# Patient Record
Sex: Male | Born: 1970 | Race: White | Hispanic: No | Smoking: Former smoker
Health system: Southern US, Community
[De-identification: ages and names within clinical notes are randomized; demographics above are authoritative.]

## PROBLEM LIST (undated history)

## (undated) DIAGNOSIS — I10 Essential (primary) hypertension: Secondary | ICD-10-CM

## (undated) HISTORY — DX: Essential (primary) hypertension: I10

## (undated) HISTORY — PX: OTHER SURGICAL HISTORY: SHX169

## (undated) HISTORY — PX: ORCHIECTOMY: SHX2116

---

## 2000-10-07 ENCOUNTER — Ambulatory Visit (HOSPITAL_COMMUNITY): Admission: RE | Admit: 2000-10-07 | Discharge: 2000-10-07 | Payer: Self-pay | Admitting: Family Medicine

## 2000-10-07 ENCOUNTER — Encounter: Payer: Self-pay | Admitting: Family Medicine

## 2001-01-17 ENCOUNTER — Emergency Department (HOSPITAL_COMMUNITY): Admission: EM | Admit: 2001-01-17 | Discharge: 2001-01-17 | Payer: Self-pay | Admitting: Emergency Medicine

## 2001-01-17 ENCOUNTER — Encounter: Payer: Self-pay | Admitting: Emergency Medicine

## 2003-07-30 ENCOUNTER — Emergency Department (HOSPITAL_COMMUNITY): Admission: EM | Admit: 2003-07-30 | Discharge: 2003-07-30 | Payer: Self-pay | Admitting: Emergency Medicine

## 2005-02-20 IMAGING — CR DG CHEST 2V
2 series · 2 of 2 positions shown · non-contrast
Comparison: none

CLINICAL DATA: Chest pain
 TWO VIEW CHEST
 No prior study for comparison.  Normal heart size, mediastinal contours and vascularity.  Mild bronchitic changes.  Lungs are otherwise clear.  No effusion or pneumothorax. Bones are unremarkable.
 IMPRESSION
 Minor bronchitic changes without acute infiltrate.

[view not recorded (1 of 2)]
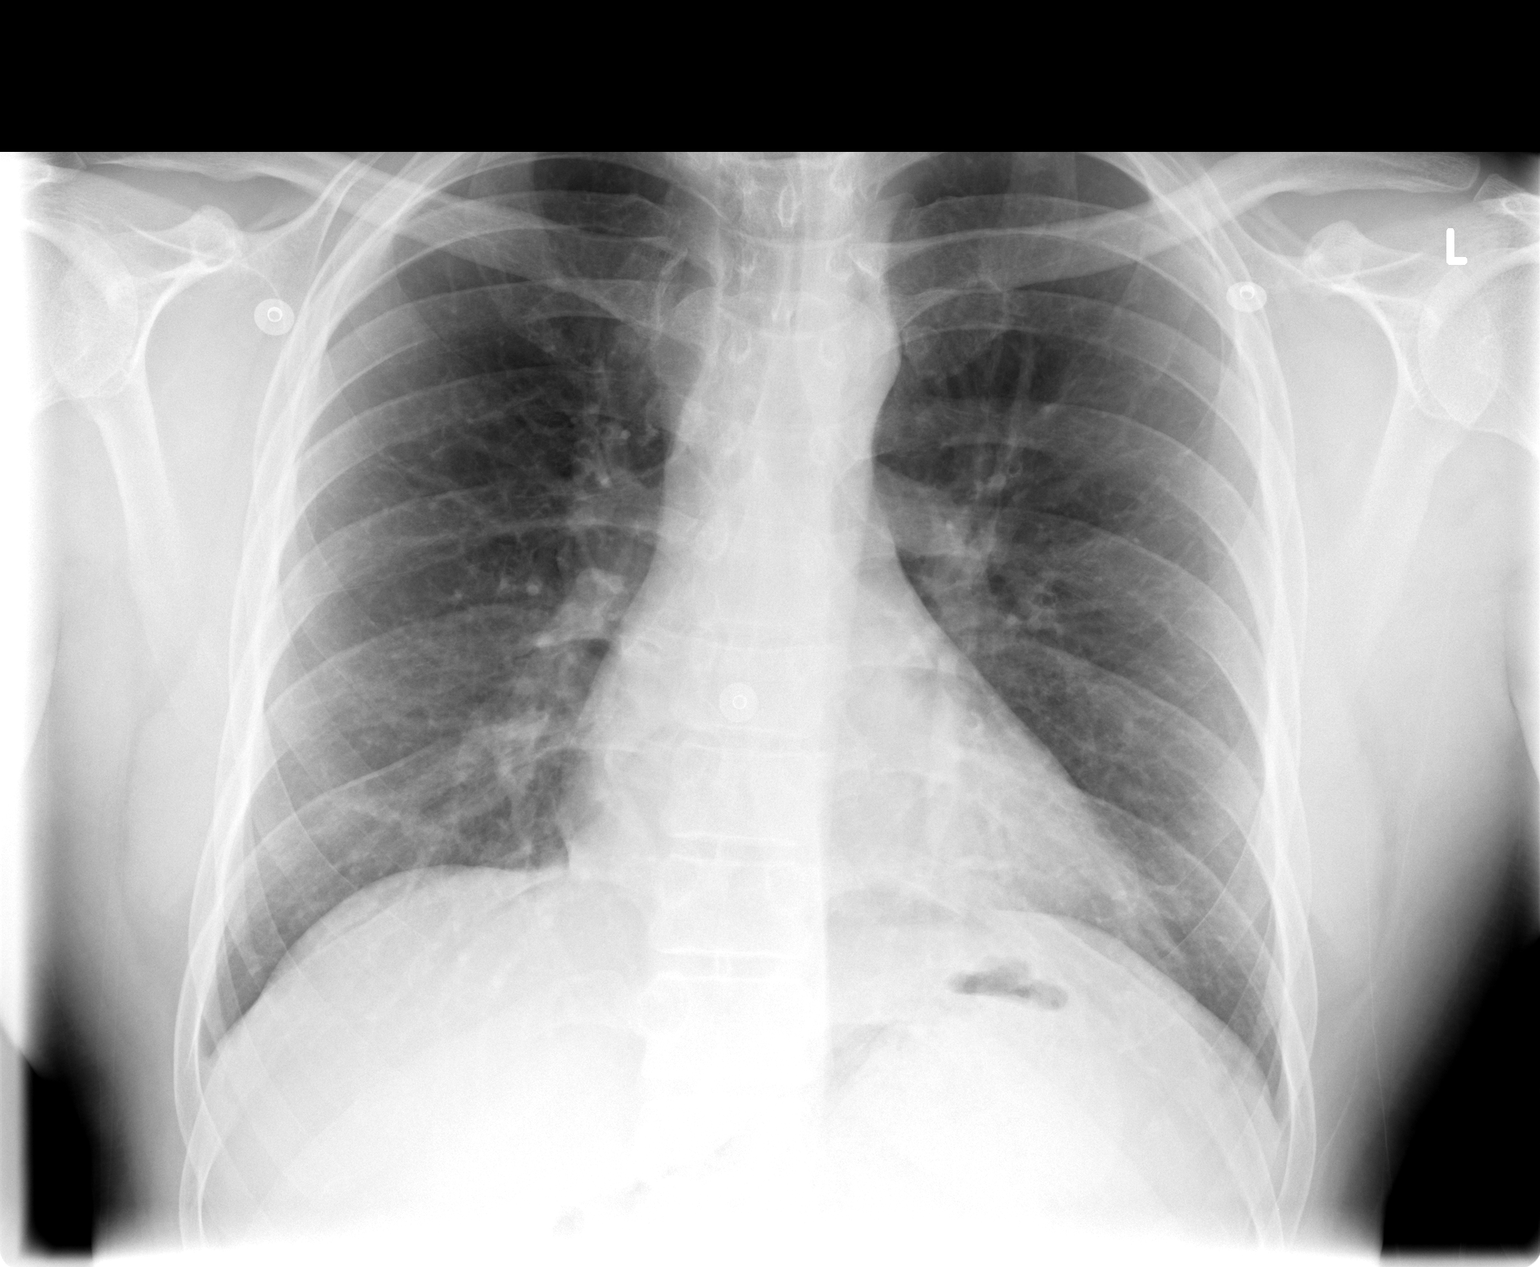

[view not recorded (2 of 2)]
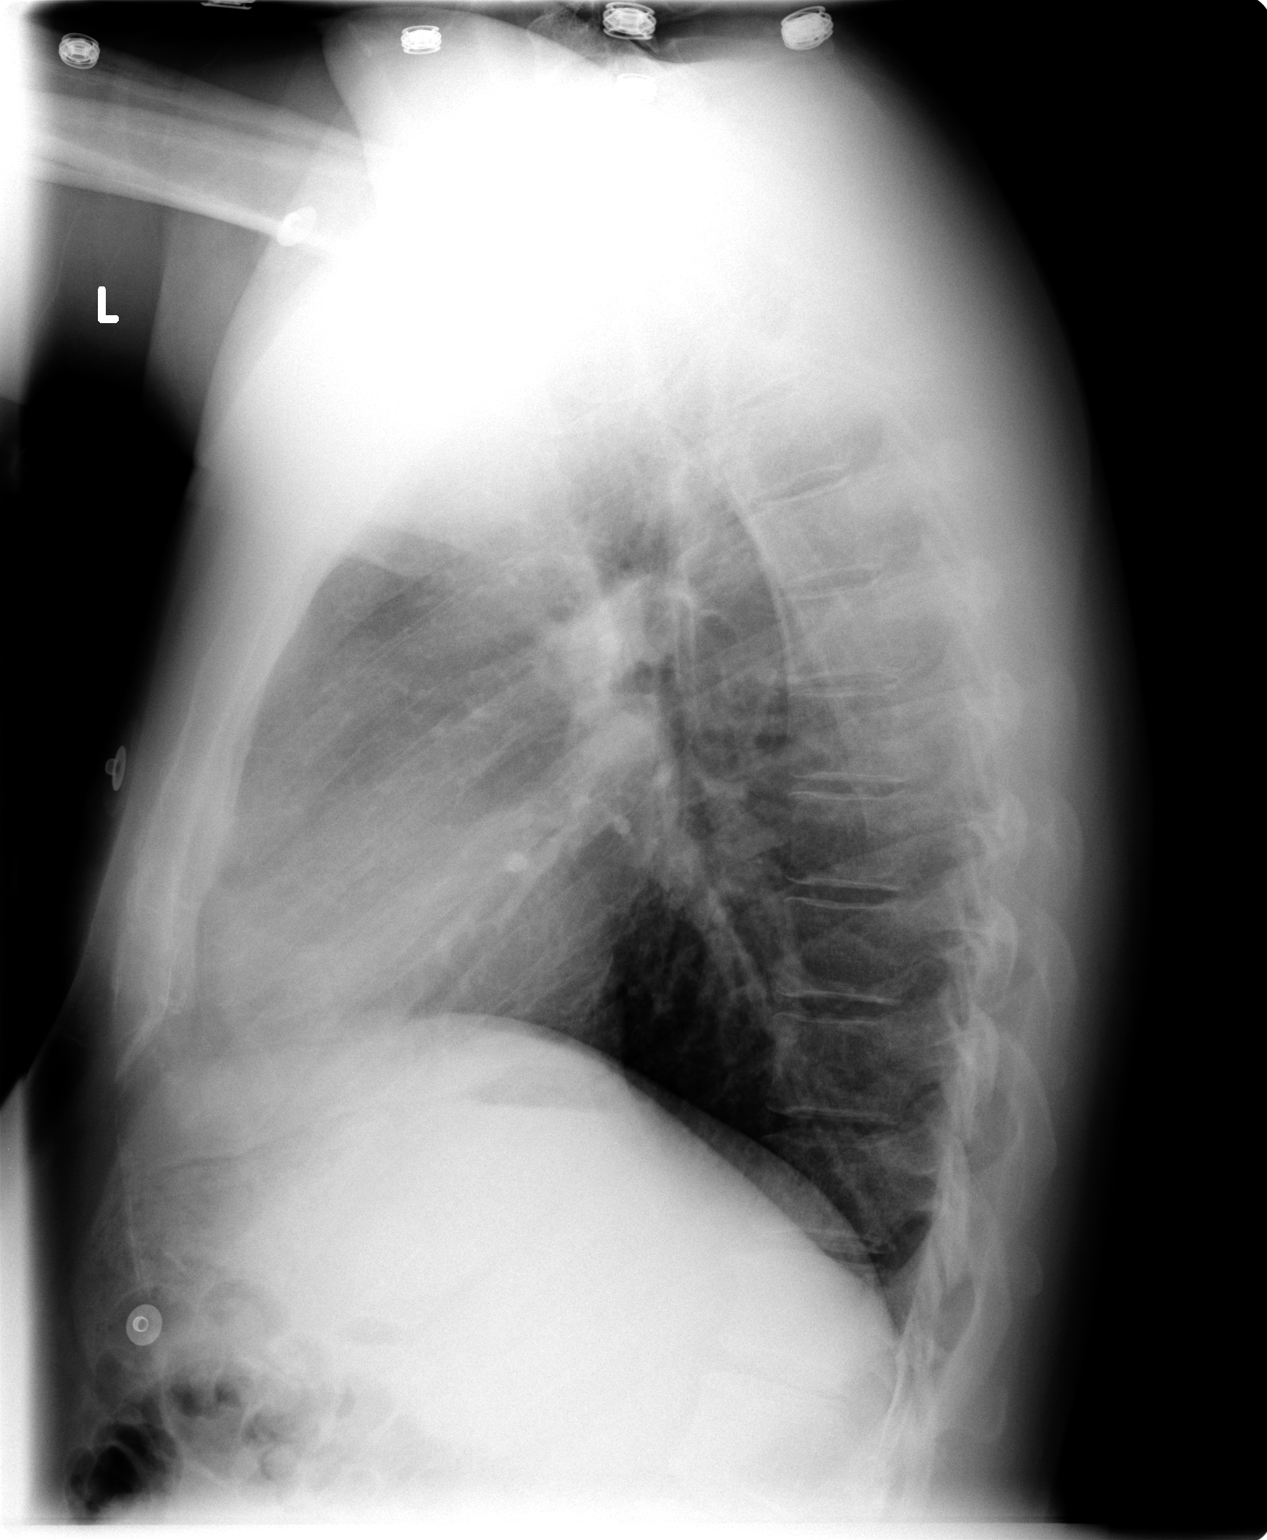

[2 of 2 positions shown; findings below may reference images not displayed]

## 2013-02-15 ENCOUNTER — Ambulatory Visit (INDEPENDENT_AMBULATORY_CARE_PROVIDER_SITE_OTHER): Payer: BC Managed Care – PPO | Admitting: Otolaryngology

## 2013-02-15 ENCOUNTER — Encounter (INDEPENDENT_AMBULATORY_CARE_PROVIDER_SITE_OTHER): Payer: Self-pay

## 2013-02-15 DIAGNOSIS — R1312 Dysphagia, oropharyngeal phase: Secondary | ICD-10-CM

## 2013-02-15 DIAGNOSIS — K219 Gastro-esophageal reflux disease without esophagitis: Secondary | ICD-10-CM

## 2013-03-15 ENCOUNTER — Ambulatory Visit (INDEPENDENT_AMBULATORY_CARE_PROVIDER_SITE_OTHER): Payer: BC Managed Care – PPO | Admitting: Otolaryngology

## 2013-03-15 DIAGNOSIS — R070 Pain in throat: Secondary | ICD-10-CM

## 2013-03-15 DIAGNOSIS — K219 Gastro-esophageal reflux disease without esophagitis: Secondary | ICD-10-CM

## 2016-03-15 ENCOUNTER — Other Ambulatory Visit: Payer: Self-pay | Admitting: Preventative Medicine

## 2016-03-15 DIAGNOSIS — M48061 Spinal stenosis, lumbar region without neurogenic claudication: Secondary | ICD-10-CM

## 2016-03-25 ENCOUNTER — Ambulatory Visit
Admission: RE | Admit: 2016-03-25 | Discharge: 2016-03-25 | Disposition: A | Discharge status: Home / Self Care | Payer: BLUE CROSS/BLUE SHIELD | Source: Ambulatory Visit | Attending: Preventative Medicine | Admitting: Preventative Medicine

## 2016-03-25 DIAGNOSIS — M48061 Spinal stenosis, lumbar region without neurogenic claudication: Secondary | ICD-10-CM

## 2016-03-25 MED ORDER — METHYLPREDNISOLONE ACETATE 40 MG/ML INJ SUSP (RADIOLOG
120.0000 mg | Freq: Once | INTRAMUSCULAR | Status: AC
Start: 2016-03-25 — End: 2016-03-25
  Administered 2016-03-25: 120 mg via EPIDURAL

## 2016-03-25 MED ORDER — IOPAMIDOL (ISOVUE-M 200) INJECTION 41%
1.0000 mL | Freq: Once | INTRAMUSCULAR | Status: AC
  Administered 2016-03-25: 1 mL via EPIDURAL

## 2016-03-25 NOTE — Discharge Instructions (Signed)

## 2017-10-17 IMAGING — XA Imaging study
2 series · 2 of 2 positions shown · non-contrast
Comparison: none

CLINICAL DATA: Spinal stenosis of the lumbar region. Displacement
of the L5-S1 lumbar disc. Left lower extremity radiculitis.

[Series 1: ortho standard · 1 of 1 slices shown (1 of 2)]
[im 1/1]
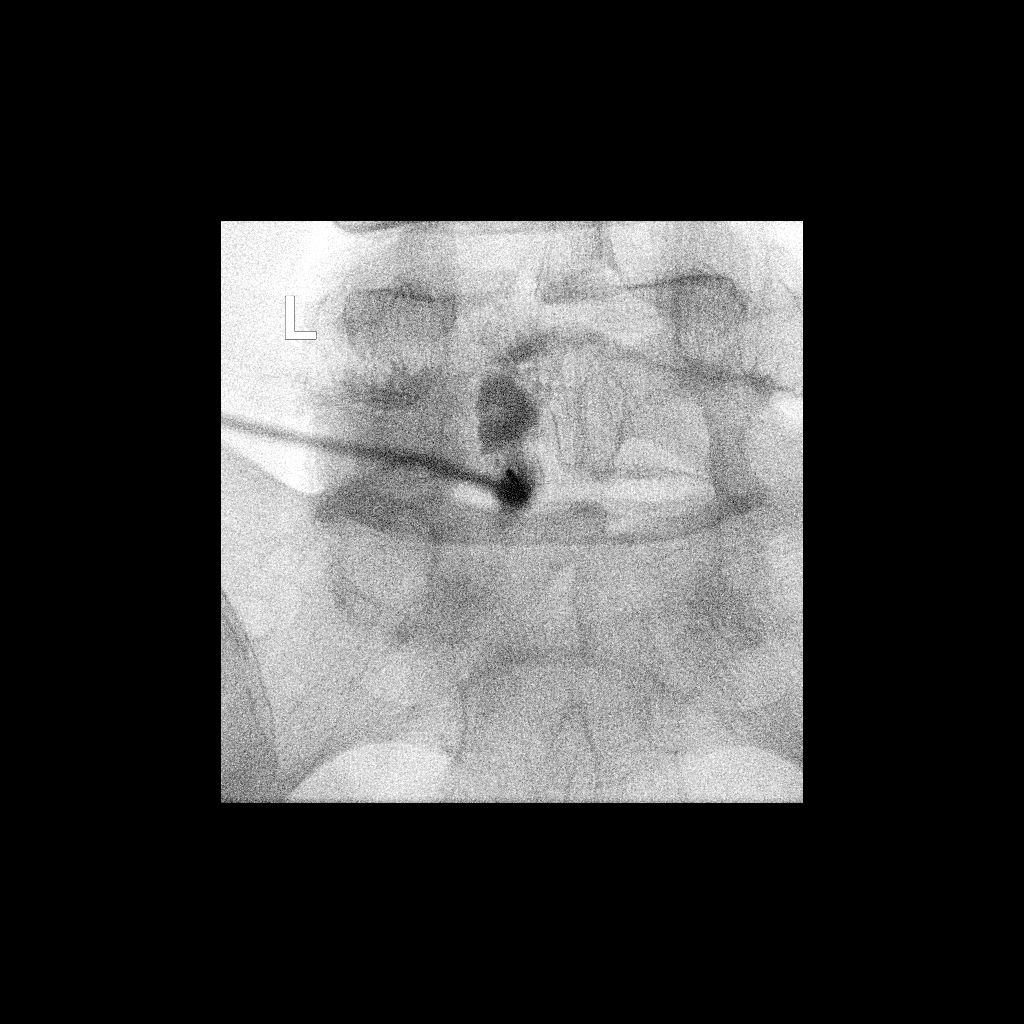

[Series 2: ortho standard · 1 of 1 slices shown (2 of 2)]
[im 1/1]
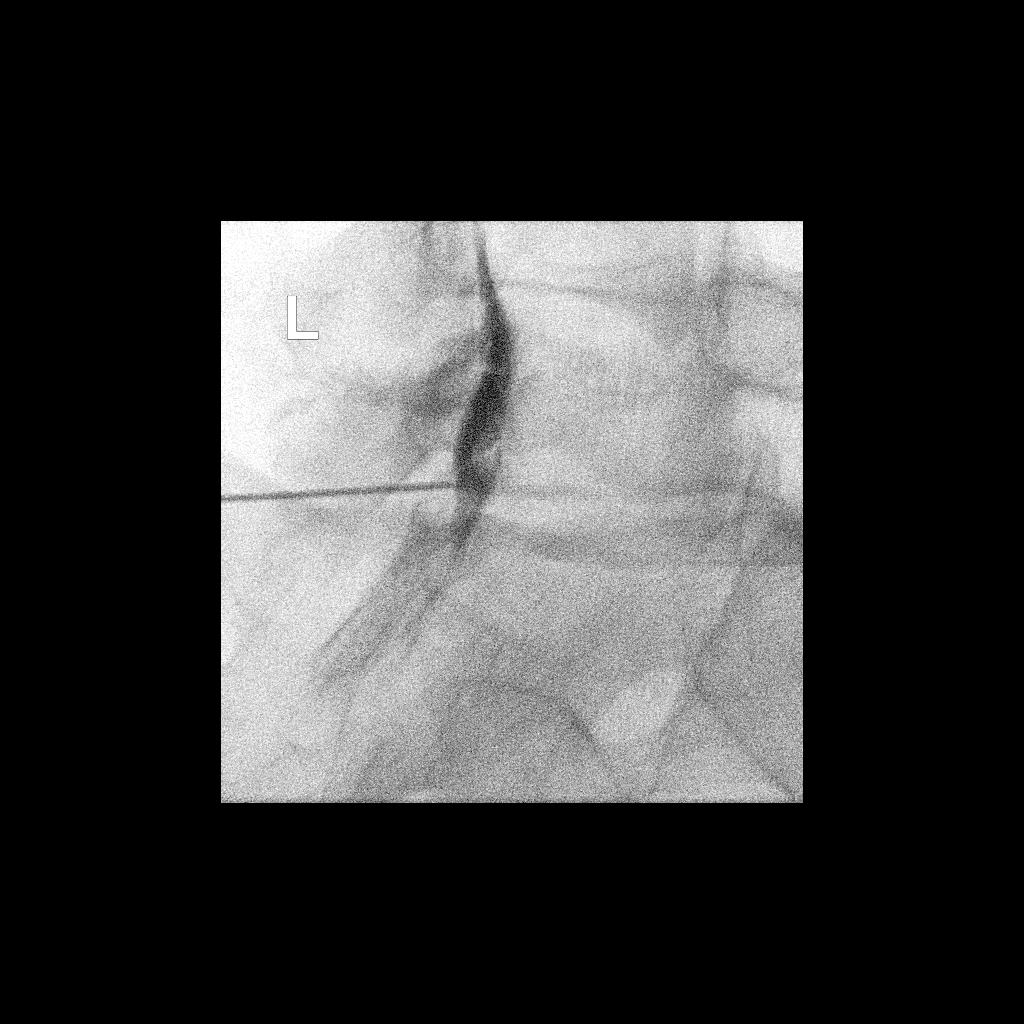

[2 of 2 positions shown; findings below may reference images not displayed]

FLUOROSCOPY TIME:  Radiation Exposure Index (as provided by the
fluoroscopic device): 14.78 uGy*m2

Fluoroscopy Time:  16 seconds

Number of Acquired Images:  0

PROCEDURE:
The procedure, risks, benefits, and alternatives were explained to
the patient. Questions regarding the procedure were encouraged and
answered. The patient understands and consents to the procedure.

LUMBAR EPIDURAL INJECTION:

An interlaminar approach was performed on right left at lumbar
level. The overlying skin was cleansed and anesthetized. A 20 gauge
epidural needle was advanced using loss-of-resistance technique.

DIAGNOSTIC EPIDURAL INJECTION:

Injection of Isovue-M 200 shows a good epidural pattern with spread
above and below the level of needle placement, primarily on the
right/left no vascular opacification is seen.

THERAPEUTIC EPIDURAL INJECTION:

120 Mg of Depo-Medrol mixed with 3 mL 1% lidocaine were instilled.
The procedure was well-tolerated, and the patient was discharged
thirty minutes following the injection in good condition.

COMPLICATIONS:
None
IMPRESSION: Technically successful epidural injection on the left L5-S1 # 1

## 2019-04-19 ENCOUNTER — Encounter: Payer: Self-pay | Admitting: Gastroenterology

## 2019-05-10 ENCOUNTER — Encounter: Payer: Self-pay | Admitting: *Deleted

## 2019-07-30 ENCOUNTER — Ambulatory Visit (INDEPENDENT_AMBULATORY_CARE_PROVIDER_SITE_OTHER): Payer: Self-pay | Admitting: *Deleted

## 2019-07-30 ENCOUNTER — Other Ambulatory Visit: Payer: Self-pay

## 2019-07-30 DIAGNOSIS — Z1211 Encounter for screening for malignant neoplasm of colon: Secondary | ICD-10-CM

## 2019-07-30 NOTE — Patient Instructions (Signed)
[place]   Please notify us immediately if you are diabetic, take iron supplements, or if you are on coumadin or any blood thinners.   Patient Name: Keith Adkins  Date of procedure: 10/17/2019  Time to register at [facility]: 6:30 Provider: Dr. Gala Romney   Purchase: MIRALAX 238 gram bottle, 1 FLEET ENEMA, 1 box of DULCOLAX (All over the counter medications)    10/15/2019- 2 Days prior to procedure: START CLEAR LIQUID DIET AFTER YOUR LUNCH MEAL--NO SOLID FOODS!   10/16/2019- 1 Day prior to procedure:   CLEAR LIQUIDS ALL DAY--NO SOLID FOODS!   Diabetic medication adjustments for today:    At 10:00 AM, take 2 DULCOLAX '5mg'$  tablets   At 12:00 PM, Mix 5 teaspoons of Miralax in any 4-6 ounces of CLEAR LIQUIDS (Gatorade) every hour for 5 hours until passing clear, watery stools. Be sure to drink 4 ounces of clear liquid 30 minutes after each dose of Miralax.   At 3:00 PM, take 2 Dulcolax '5mg'$  tablets   If stools are not clear & watery by 6:00 PM, take 5 teaspoons of Miralax every 30 minutes until stools are clear (no color)   You must have a complete prep to ensure the most effective cleaning.   CONTINUE CLEAR LIQUIDS ONLY UNTIL MIDNIGHT. Make a conscious effort to drink as much as you can before, during & after the preparation.    NOTHING TO EAT OR DRINK AFTER MIDNIGHT except for your heart, blood pressure & breathing medications. You may take them with a sip of clear liquids.   10/17/2019-- Day of Procedure  Give yourself one Fleet enema about 1 hour prior to leaving for the hospital.   Diabetic medication adjustments for today:   You may take TYLENOL products. Please continue your regular medications unless we have instructed you otherwise.    Please note, on the day of your procedure you MUST be accompanied by an adult who is willing to assume responsibility for you at time of discharge. If you do not have such person with you, your procedure  will have to be rescheduled.                                                             Please leave ALL jewelry at home prior to coming to the hospital for your procedure.   *It is your responsibility to check with your insurance company for the benefits of coverage you have for this procedure. Unfortunately, not all insurance companies have benefits to cover all or part of these types of procedures. It is your responsibility to check your benefits, however we will be glad to assist you with any codes your insurance company may need.   Please note that most insurance companies will not cover a screening colonoscopy for people under the age of 26. For example, with some insurance companies you may have benefits for a screening colonoscopy, but if polyps are found the diagnosis will change and then you may have a deductible that will need to be met. Please make sure you check your benefits for screening colonoscopy as well as a diagnostic colonoscopy.    CLEAR LIQUIDS: (NO RED)  Jello Apple Juice White Grape Juice Water  Banana popsicles Kool-Aid Coffee(No cream or milk)  Tea (  No cream or milk) Soft drinks Broth (fat free beef/chicken/vegetable)   Clear liquids allow you to see your fingers on the other side of the glass. Be sure they are NOT RED in color, cloudy, but CLEAR.   Do Not Eat:  Dairy products of any kind Cranberry juice  Tomato or V8 Juice Orange Juice  Grapefruit Juice Red Grape Juice  Solid foods like cereal, oatmeal, yogurt, fruits, vegetables, creamed soups, eggs, bread, etc   HELPFUL HINTS TO MAKE DRINKING EASIER:  -Make sure prep is extremely COLD. Refrigerate the night before. You may also put in freezer.  -You may try mixing Crystal Light or Country Time Lemonade if you prefer. MIx in small amounts. Add more if necessary.  -Trying drinking through a straw.  -Rinse mouth with water or mouthwash between  glasses to remove aftertaste.  -Try sipping on a cold beverage/ice popsicles between glasses of prep.  -Place a piece of sugar-free hard candy in mouth between glasses.  -If you become nauseated, try consuming smaller amounts or stretch out the time between glasses. Stop for 30 minutes to an hour & slowly start back drinking.   Call our office with any questions or concerns at [phone].   Thank You

## 2019-07-30 NOTE — Progress Notes (Signed)
I think given chronic moderate ETOH intake the patient will likely benefit from propofol/MAC

## 2019-07-30 NOTE — Progress Notes (Addendum)
Gastroenterology Pre-Procedure Review  Request Date: 07/30/2019 Requesting Physician: Dr. Sudie Bailey, no previous TCS  PATIENT REVIEW QUESTIONS: The patient responded to the following health history questions as indicated:    1. Diabetes Melitis: no 2. Joint replacements in the past 12 months: no 3. Major health problems in the past 3 months: no 4. Has an artificial valve or MVP: no 5. Has a defibrillator: no 6. Has been advised in past to take antibiotics in advance of a procedure like teeth cleaning: no 7. Family history of colon cancer: no  8. Alcohol Use: yes, 12 beers a week 9. Illicit drug Use: no 10. History of sleep apnea: no  11. History of coronary artery or other vascular stents placed within the last 12 months: no 12. History of any prior anesthesia complications: no 13. There is no height or weight on file to calculate BMI. ht: 5'10 wt: 165 lbs    MEDICATIONS & ALLERGIES:    Patient reports the following regarding taking any blood thinners:   Plavix? no Aspirin? no Coumadin? no Brilinta? no Xarelto? no Eliquis? no Pradaxa? no Savaysa? no Effient? no  Patient confirms/reports the following medications:  Current Outpatient Medications  Medication Sig Dispense Refill  . lisinopril-hydrochlorothiazide (ZESTORETIC) 20-25 MG tablet Take 1 tablet by mouth every morning.     No current facility-administered medications for this visit.    Patient confirms/reports the following allergies:  No Known Allergies  No orders of the defined types were placed in this encounter.   AUTHORIZATION INFORMATION Primary Insurance: [place],  [place] #: [id number]  Group #: [id number] Pre-Cert / Berkley Harvey required: No, not required  SCHEDULE INFORMATION: Procedure has been scheduled as follows:  Date: 10/17/2019, Time: 7:30 Location: [facility] with Dr. Jena Gauss  This Gastroenterology Pre-Precedure Review Form is being routed to the following provider(s): Wynne Dust, NP

## 2019-07-31 NOTE — Progress Notes (Signed)
Called pt and scheduled him for ov 08/20/2019 at 8:30 with EG.  Pt is aware that we are cancelling his procedure and Covid screening.  Pt voiced understanding.

## 2019-08-20 ENCOUNTER — Ambulatory Visit: Payer: BLUE CROSS/BLUE SHIELD | Admitting: Nurse Practitioner

## 2019-08-20 NOTE — Progress Notes (Deleted)
Primary Care Physician:  Lemmie Evens, MD Primary Gastroenterologist:  Dr. Gala Romney  No chief complaint on file.   HPI:   Keith Adkins is a 49 y.o. male who presents to schedule colonoscopy.  Nurse/phone triage was deferred office visit due to EtOH likely necessitating MAC sedation.  No history of colonoscopy in our system.  Today she states   No past medical history on file.  *** The histories are not reviewed yet. Please review them in the "History" navigator section and refresh this [facility].  Current Outpatient Medications  Medication Sig Dispense Refill  . lisinopril-hydrochlorothiazide (ZESTORETIC) 20-25 MG tablet Take 1 tablet by mouth every morning.     No current facility-administered medications for this visit.    Allergies as of 08/20/2019  . (No Known Allergies)    No family history on file.  Social History   Socioeconomic History  . Marital status: Married    Spouse name: Not on file  . Number of children: Not on file  . Years of education: Not on file  . Highest education level: Not on file  Occupational History  . Not on file  Tobacco Use  . Smoking status: Not on file  Substance and Sexual Activity  . Alcohol use: Not on file  . Drug use: Not on file  . Sexual activity: Not on file  Other Topics Concern  . Not on file  Social History Narrative  . Not on file   Social Determinants of Health   Financial Resource Strain:   . Difficulty of Paying Living Expenses:   Food Insecurity:   . Worried About Charity fundraiser in the Last Year:   . Arboriculturist in the Last Year:   Transportation Needs:   . Film/video editor (Medical):   Marland Kitchen Lack of Transportation (Non-Medical):   Physical Activity:   . Days of Exercise per Week:   . Minutes of Exercise per Session:   Stress:   . Feeling of Stress :   Social Connections:   . Frequency of Communication with Friends and Family:   . Frequency of Social Gatherings with Friends and  Family:   . Attends Religious Services:   . Active Member of Clubs or Organizations:   . Attends Archivist Meetings:   Marland Kitchen Marital Status:   Intimate Partner Violence:   . Fear of Current or Ex-Partner:   . Emotionally Abused:   Marland Kitchen Physically Abused:   . Sexually Abused:     Subjective: Review of Systems  Constitutional: Negative for chills, fever, malaise/fatigue and weight loss.  HENT: Negative for congestion and sore throat.   Respiratory: Negative for cough and shortness of breath.   Cardiovascular: Negative for chest pain and palpitations.  Gastrointestinal: Negative for abdominal pain, blood in stool, diarrhea, melena, nausea and vomiting.  Musculoskeletal: Negative for joint pain and myalgias.  Skin: Negative for rash.  Neurological: Negative for dizziness and weakness.  Endo/Heme/Allergies: Does not bruise/bleed easily.  Psychiatric/Behavioral: Negative for depression. The patient is not nervous/anxious.   All other systems reviewed and are negative.      Objective: There were no vitals taken for this visit. Physical Exam Vitals and nursing note reviewed.  Constitutional:      General: He is not in acute distress.    Appearance: Normal appearance. He is not ill-appearing, toxic-appearing or diaphoretic.  HENT:     Head: Normocephalic and atraumatic.     Nose: No congestion or rhinorrhea.  Eyes:     General: No scleral icterus. Cardiovascular:     Rate and Rhythm: Normal rate and regular rhythm.     Heart sounds: Normal heart sounds.  Pulmonary:     Effort: Pulmonary effort is normal.     Breath sounds: Normal breath sounds.  Abdominal:     General: Bowel sounds are normal. There is no distension.     Palpations: Abdomen is soft. There is no hepatomegaly, splenomegaly or mass.     Tenderness: There is no abdominal tenderness. There is no guarding or rebound.     Hernia: No hernia is present.  Musculoskeletal:     Cervical back: Neck supple.  Skin:     General: Skin is warm and dry.     Coloration: Skin is not jaundiced.     Findings: No bruising or rash.  Neurological:     General: No focal deficit present.     Mental Status: He is alert and oriented to person, place, and time. Mental status is at baseline.  Psychiatric:        Mood and Affect: Mood normal.        Behavior: Behavior normal.        Thought Content: Thought content normal.       08/20/2019 7:55 AM   Disclaimer: This note was dictated with voice recognition software. Similar sounding words can inadvertently be transcribed and may not be corrected upon review.

## 2019-08-21 ENCOUNTER — Encounter: Payer: Self-pay | Admitting: Gastroenterology

## 2019-08-21 NOTE — Progress Notes (Signed)
Referring Provider: Dr. Karie Kirks Primary Care Physician:  Lemmie Evens, MD Primary Gastroenterologist:  Dr. Gala Romney  Chief Complaint  Patient presents with  . Colonoscopy    never had tcs    HPI:   Keith Adkins is a 49 y.o. male presenting today at the request of Dr. Karie Kirks for colon cancer screening.  No prior colonoscopy.  Patient was previously a nurse triage; however, due to report of drinking about 12 beers a week, it was recommended that patient would likely benefit from propofol/MAC.   Reviewed labs included with referral dated 04/19/2019.  Hemoglobin slightly low at 12.9 with normocytic indices.  CMP within normal limits.  See labs below. Per PCP addendum on his note, Dr. Karie Kirks states he reviewed patient's prior hemoglobins between 10/04/2011 and 02/10/2018 with 5 test during this time and all hemoglobins were between 14 and 15.  Today: No prior coloscopy. No abdominal pain. BMs daily. No constipation or diarrhea. No blood the the stool or black stool. No GERD symptoms, heartburn, dysphagia. No unintentional weight loss. No family history of colon cancer.   No NSAIDs.   No known history of anemia.   Past Medical History:  Diagnosis Date  . HTN (hypertension)     History reviewed. No pertinent surgical history.  Current Outpatient Medications  Medication Sig Dispense Refill  . lisinopril-hydrochlorothiazide (ZESTORETIC) 20-25 MG tablet Take 1 tablet by mouth every morning.     No current facility-administered medications for this visit.    Allergies as of 08/22/2019  . (No Known Allergies)    Family History  Problem Relation Age of Onset  . Colon cancer Neg Hx     Social History   Socioeconomic History  . Marital status: Married    Spouse name: Not on file  . Number of children: Not on file  . Years of education: Not on file  . Highest education level: Not on file  Occupational History  . Not on file  Tobacco Use  . Smoking status: Former Research scientist (life sciences)   . Smokeless tobacco: Never Used  Substance and Sexual Activity  . Alcohol use: Yes    Alcohol/week: 12.0 standard drinks    Types: 12 Cans of beer per week    Comment: 12 beer per week  . Drug use: Never  . Sexual activity: Not on file  Other Topics Concern  . Not on file  Social History Narrative  . Not on file   Social Determinants of Health   Financial Resource Strain:   . Difficulty of Paying Living Expenses:   Food Insecurity:   . Worried About Charity fundraiser in the Last Year:   . Arboriculturist in the Last Year:   Transportation Needs:   . Film/video editor (Medical):   Marland Kitchen Lack of Transportation (Non-Medical):   Physical Activity:   . Days of Exercise per Week:   . Minutes of Exercise per Session:   Stress:   . Feeling of Stress :   Social Connections:   . Frequency of Communication with Friends and Family:   . Frequency of Social Gatherings with Friends and Family:   . Attends Religious Services:   . Active Member of Clubs or Organizations:   . Attends Archivist Meetings:   Marland Kitchen Marital Status:   Intimate Partner Violence:   . Fear of Current or Ex-Partner:   . Emotionally Abused:   Marland Kitchen Physically Abused:   . Sexually Abused:     Review  of Systems: Gen: Denies any fever, chills, lightheadedness, dizziness, presyncope, syncope CV: Denies chest pain or heart palpitations Resp: Denies shortness of breath or cough GI: See HPI GU : Denies urinary burning, urinary frequency, urinary hesitancy MS: Denies joint pain Derm: Denies rash Psych: Denies depression or anxiety Heme: Denies bruising or bleeding  Physical Exam: BP 119/69   Pulse (!) 55   Temp (!) 96.8 F (36 C) (Temporal)   Ht _0  (1.778 m)   Wt 164 lb (74.4 kg)   BMI 23.53 kg/m  General:   Alert and oriented. Pleasant and cooperative. Well-nourished and well-developed.  Head:  Normocephalic and atraumatic. Eyes:  Without icterus, sclera clear and conjunctiva pink.  Ears:   Normal auditory acuity. Lungs:  Clear to auscultation bilaterally. No wheezes, rales, or rhonchi. No distress.  Heart:  S1, S2 present without murmurs appreciated.  Abdomen:  +BS, soft, non-tender and non-distended. No HSM noted. No guarding or rebound. No masses appreciated.  Rectal:  Deferred  Msk:  Symmetrical without gross deformities. Normal posture. Extremities:  Without edema. Neurologic:  Alert and  oriented x4;  grossly normal neurologically. Skin:  Intact without significant lesions or rashes. Psych: Normal mood and affect.  Labs: 04/19/2019 CBC: WBC 4.2, hemoglobin 12.9 (L), MCV 95.9, MCH 32.7, MCHC 34.1, platelets 159. CMP: Glucose 100 (H), creatinine 0.99, sodium 136, potassium 4.6, chloride 99, calcium 9.4, albumin 4.3, total bilirubin 0.8, alk phos 98, AST 30, ALT 27 PSA 0.4 Lipid panel: Total cholesterol 203 (H), LDL 106 (H), HDL 83, triglycerides 58

## 2019-08-22 ENCOUNTER — Encounter: Payer: Self-pay | Admitting: Gastroenterology

## 2019-08-22 ENCOUNTER — Ambulatory Visit: Payer: BLUE CROSS/BLUE SHIELD | Admitting: Gastroenterology

## 2019-08-22 ENCOUNTER — Other Ambulatory Visit: Payer: Self-pay

## 2019-08-22 DIAGNOSIS — Z1211 Encounter for screening for malignant neoplasm of colon: Secondary | ICD-10-CM

## 2019-08-22 DIAGNOSIS — D649 Anemia, unspecified: Secondary | ICD-10-CM | POA: Insufficient documentation

## 2019-08-22 NOTE — Patient Instructions (Signed)
Please have labs completed at [facility] lab.  We will get you scheduled for colonoscopy with possible upper endoscopy in the near future with Dr. Jena Gauss.  You will need the upper endoscopy if your hemoglobin is still low with any component of iron deficiency.   We will see you back as recommended as Dr. Jena Gauss recommends at the time of your procedures.  Ermalinda Memos, PA-C [facility]

## 2019-08-23 NOTE — Assessment & Plan Note (Addendum)
49 year old male with medical history of hypertension presenting to schedule his first ever colonoscopy.  He denies any significant upper or lower GI symptoms.  No BRBPR, melena, or unintentional weight loss.  No family history of colon cancer.  Notably, labs included in referral with hemoglobin slightly low at 12.9 with normocytic indices.  Per PCP note, prior hemoglobins since 2013 have been between 14-15 range.  Patient denies NSAID use.  He does admit to drinking about 12 beers per week which could be playing a role in his mild anemia but can't rule out occult blood loss from upper or lower GI tract.  No iron panel on file.  Patient needs first ever screening colonoscopy with propofol with Dr. Jena Gauss in the near future.   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   Due to mild anemia, plan to repeat CBC and check an iron panel and add possible EGD to colonoscopy.  Further recommendations regarding necessity of EGD pending lab results. The risks, benefits, and alternatives to TCS/EGD have been discussed in detail with patient. They have stated understanding and desire to proceed.  Plan follow-up as recommended at the time of procedures.

## 2019-08-23 NOTE — Assessment & Plan Note (Signed)
Addressed under colon cancer screening. 

## 2019-09-07 ENCOUNTER — Emergency Department (HOSPITAL_COMMUNITY)
Admission: EM | Admit: 2019-09-07 | Discharge: 2019-09-07 | Disposition: A | Discharge status: Home / Self Care | Payer: BC Managed Care – PPO | Attending: Emergency Medicine | Admitting: Emergency Medicine

## 2019-09-07 DIAGNOSIS — R519 Headache, unspecified: Secondary | ICD-10-CM | POA: Diagnosis not present

## 2019-09-07 DIAGNOSIS — I10 Essential (primary) hypertension: Secondary | ICD-10-CM | POA: Diagnosis not present

## 2019-09-07 DIAGNOSIS — F1910 Other psychoactive substance abuse, uncomplicated: Secondary | ICD-10-CM | POA: Diagnosis not present

## 2019-09-07 DIAGNOSIS — Z79899 Other long term (current) drug therapy: Secondary | ICD-10-CM | POA: Insufficient documentation

## 2019-09-07 DIAGNOSIS — F10929 Alcohol use, unspecified with intoxication, unspecified: Secondary | ICD-10-CM | POA: Diagnosis not present

## 2019-09-07 DIAGNOSIS — R42 Dizziness and giddiness: Secondary | ICD-10-CM | POA: Insufficient documentation

## 2019-09-07 DIAGNOSIS — Y905 Blood alcohol level of 100-119 mg/100 ml: Secondary | ICD-10-CM | POA: Diagnosis not present

## 2019-09-07 DIAGNOSIS — T407X1A Poisoning by cannabis (derivatives), accidental (unintentional), initial encounter: Secondary | ICD-10-CM | POA: Diagnosis present

## 2019-09-07 LAB — CBC WITH DIFFERENTIAL/PLATELET
Abs Immature Granulocytes: 0.03 10*3/uL (ref 0.00–0.07)
Basophils Absolute: 0 10*3/uL (ref 0.0–0.1)
Basophils Relative: 0 %
Eosinophils Absolute: 0 10*3/uL (ref 0.0–0.5)
Eosinophils Relative: 0 %
HCT: 43 % (ref 39.0–52.0)
Hemoglobin: 14.3 g/dL (ref 13.0–17.0)
Immature Granulocytes: 0 %
Lymphocytes Relative: 31 %
Lymphs Abs: 2.7 10*3/uL (ref 0.7–4.0)
MCH: 32.3 pg (ref 26.0–34.0)
MCHC: 33.3 g/dL (ref 30.0–36.0)
MCV: 97.1 fL (ref 80.0–100.0)
Monocytes Absolute: 0.7 10*3/uL (ref 0.1–1.0)
Monocytes Relative: 8 %
Neutro Abs: 5.2 10*3/uL (ref 1.7–7.7)
Neutrophils Relative %: 61 %
Platelets: 205 10*3/uL (ref 150–400)
RBC: 4.43 MIL/uL (ref 4.22–5.81)
RDW: 12.4 % (ref 11.5–15.5)
WBC: 8.6 10*3/uL (ref 4.0–10.5)
nRBC: 0 % (ref 0.0–0.2)

## 2019-09-07 LAB — COMPREHENSIVE METABOLIC PANEL
ALT: 30 U/L (ref 0–44)
AST: 37 U/L (ref 15–41)
Albumin: 4.5 g/dL (ref 3.5–5.0)
Alkaline Phosphatase: 118 U/L (ref 38–126)
Anion gap: 15 (ref 5–15)
BUN: 24 mg/dL — ABNORMAL HIGH (ref 6–20)
CO2: 22 mmol/L (ref 22–32)
Calcium: 9.1 mg/dL (ref 8.9–10.3)
Chloride: 100 mmol/L (ref 98–111)
Creatinine, Ser: 1.08 mg/dL (ref 0.61–1.24)
GFR calc Af Amer: >60 mL/min (ref >60)
GFR calc non Af Amer: >60 mL/min (ref >60)
Glucose, Bld: 102 mg/dL — ABNORMAL HIGH (ref 70–99)
Potassium: 3.3 mmol/L — ABNORMAL LOW (ref 3.5–5.1)
Sodium: 137 mmol/L (ref 135–145)
Total Bilirubin: 0.7 mg/dL (ref 0.3–1.2)
Total Protein: 7.9 g/dL (ref 6.5–8.1)

## 2019-09-07 LAB — AMMONIA: Ammonia: 18 umol/L (ref 9–35)

## 2019-09-07 LAB — ETHANOL: Alcohol, Ethyl (B): 116 mg/dL — ABNORMAL HIGH (ref <10)

## 2019-09-07 LAB — SALICYLATE LEVEL: Salicylate Lvl: <7 mg/dL — ABNORMAL LOW (ref 7.0–30.0)

## 2019-09-07 LAB — ACETAMINOPHEN LEVEL: Acetaminophen (Tylenol), Serum: <10 ug/mL — ABNORMAL LOW (ref 10–30)

## 2019-09-07 MED ORDER — SODIUM CHLORIDE 0.9 % IV BOLUS (SEPSIS)
1000.0000 mL | Freq: Once | INTRAVENOUS | Status: AC
  Administered 2019-09-07: 1000 mL via INTRAVENOUS

## 2019-09-07 NOTE — Discharge Instructions (Addendum)
Please be sure to stay hydrated.  Please get plenty of rest.

## 2019-09-07 NOTE — ED Triage Notes (Signed)
Pt states he was drinking tonight and took 3 thc gummies.  Pt states he feels very strange like he is going to pass out.  Pt denies pain

## 2019-09-07 NOTE — ED Provider Notes (Signed)
[facility] EMERGENCY DEPARTMENT Provider Note   CSN: [id number] Arrival date & time: 09/07/19  0021     History Chief Complaint  Patient presents with   Drug Overdose    Keith Adkins is a 49 y.o. male.  The history is provided by the patient.  Drug Overdose This is a new problem. The problem has been rapidly improving. Associated symptoms include headaches. Pertinent negatives include no chest pain and no abdominal pain. Nothing aggravates the symptoms. Nothing relieves the symptoms.   Patient presents after accidental overdose.  Patient reports he was drinking beer and wine earlier in the night.  That he decided to take 3 Gummies that was laced with THC.  Soon after he began having dizziness feeling lightheaded.  He reports he felt he was in a pass out.  After being in the ER for several hours now feels improved.  He reports mild headache and feeling like he has a hangover.    Past Medical History:  Diagnosis Date   HTN (hypertension)     Patient Active Problem List   Diagnosis Date Noted   Anemia 08/22/2019   Colon cancer screening 08/22/2019    No past surgical history on file.     Family History  Problem Relation Age of Onset   Colon cancer Neg Hx     Social History   Tobacco Use   Smoking status: Former Smoker   Smokeless tobacco: Never Used  Substance Use Topics   Alcohol use: Yes    Alcohol/week: 12.0 standard drinks    Types: 12 Cans of beer per week    Comment: 12 beer per week   Drug use: Never    Home Medications Prior to Admission medications   Medication Sig Start Date End Date Taking? Authorizing Provider  lisinopril-hydrochlorothiazide (ZESTORETIC) 20-25 MG tablet Take 1 tablet by mouth every morning. 04/09/19   [provider]    Allergies    Patient has no known allergies.  Review of Systems   Review of Systems  Constitutional: Negative for fever.  Cardiovascular: Negative for chest pain.  Gastrointestinal:  Negative for abdominal pain.  Neurological: Positive for dizziness and headaches.  All other systems reviewed and are negative.   Physical Exam Updated Vital Signs BP 129/72    Pulse 80    Temp 98 F (36.7 C) (Oral)    Resp 13    Ht 1.778 m (5\' 10" )    Wt 72.6 kg    SpO2 99%    BMI 22.96 kg/m   Physical Exam CONSTITUTIONAL: Well developed/well nourished HEAD: Normocephalic/atraumatic EYES: EOMI/PERRL ENMT: Mucous membranes moist NECK: supple no meningeal signs SPINE/BACK:entire spine nontender CV: S1/S2 noted, no murmurs/rubs/gallops noted LUNGS: Lungs are clear to auscultation bilaterally, no apparent distress ABDOMEN: soft, nontender, no rebound or guarding, bowel sounds noted throughout abdomen GU:no cva tenderness NEURO: Pt is awake/alert/appropriate, moves all extremitiesx4.  No facial droop.  EXTREMITIES: pulses normal/equal, full ROM SKIN: warm, color normal PSYCH: no abnormalities of mood noted, alert and oriented to situation  ED Results / Procedures / Treatments   Labs (all labs ordered are listed, but only abnormal results are displayed) Labs Reviewed  COMPREHENSIVE METABOLIC PANEL - Abnormal; Notable for the following components:      Result Value   Potassium 3.3 (*)    Glucose, Bld 102 (*)    BUN 24 (*)    All other components within normal limits  ACETAMINOPHEN LEVEL - Abnormal; Notable for the following components:  Acetaminophen (Tylenol), Serum <10 (*)    All other components within normal limits  ETHANOL - Abnormal; Notable for the following components:   Alcohol, Ethyl (B) 116 (*)    All other components within normal limits  SALICYLATE LEVEL - Abnormal; Notable for the following components:   Salicylate Lvl <4.8 (*)    All other components within normal limits  CBC WITH DIFFERENTIAL/PLATELET  AMMONIA    EKG EKG Interpretation  Date/Time:  Friday September 07 2019 00:42:23 EDT Ventricular Rate:  112 PR Interval:    QRS Duration: 121 QT  Interval:  356 QTC Calculation: 486 R Axis:   69 Text Interpretation: Sinus tachycardia Right bundle branch block Borderline ST depression, lateral leads Interpretation limited secondary to artifact No previous ECGs available Confirmed by Ripley Fraise [phone]) on 09/07/2019 12:44:15 AM   Radiology No results found.  Procedures Procedures   Medications Ordered in ED Medications  sodium chloride 0.9 % bolus 1,000 mL (0 mLs Intravenous Stopped 09/07/19 0422)    ED Course  I have reviewed the triage vital signs and the nursing notes.  Pertinent labs results that were available during my care of the patient were reviewed by me and considered in my medical decision making (see chart for details).    MDM Rules/Calculators/A&P                      By the Time of my evaluation patient was already improved.  He is in no distress.  He is ambulatory.  This was an accidental overdose.  I cautioned use of edible marijuana.  Patient stable for discharge Labs reassuring.  Patient improved with IV fluids Final Clinical Impression(s) / ED Diagnoses Final diagnoses:  Polysubstance abuse [facility])    Rx / DC Orders ED Discharge Orders    None       Ripley Fraise, MD 09/07/19 [phone]

## 2019-09-07 NOTE — ED Notes (Signed)
pt had steady gait while ambulating, stand by assist, states he feels better and is ready to go. MD made aware

## 2019-09-07 NOTE — ED Notes (Signed)
Pt states he took 3 10 mg THC gummies & drank 8 beers w/a couple of shots.   Patty at poison control contacted @ [phone], only recommendation is fluids at this time. Patty states benzos can be given for extreme agitation, but to wait until fluids are finished. No time limit on observation, can d/c when symptoms resolve. MD made aware.

## 2019-09-25 LAB — CBC WITH DIFFERENTIAL/PLATELET
Absolute Monocytes: 626 cells/uL (ref 200–950)
Basophils Absolute: 18 cells/uL (ref 0–200)
Basophils Relative: 0.4 %
Eosinophils Absolute: 9 cells/uL — ABNORMAL LOW (ref 15–500)
Eosinophils Relative: 0.2 %
HCT: 38 % — ABNORMAL LOW (ref 38.5–50.0)
Hemoglobin: 13.1 g/dL — ABNORMAL LOW (ref 13.2–17.1)
Lymphs Abs: 1325 cells/uL (ref 850–3900)
MCH: 33.1 pg — ABNORMAL HIGH (ref 27.0–33.0)
MCHC: 34.5 g/dL (ref 32.0–36.0)
MCV: 96 fL (ref 80.0–100.0)
MPV: 11.1 fL (ref 7.5–12.5)
Monocytes Relative: 13.6 %
Neutro Abs: 2622 cells/uL (ref 1500–7800)
Neutrophils Relative %: 57 %
Platelets: 174 10*3/uL (ref 140–400)
RBC: 3.96 10*6/uL — ABNORMAL LOW (ref 4.20–5.80)
RDW: 11.8 % (ref 11.0–15.0)
Total Lymphocyte: 28.8 %
WBC: 4.6 10*3/uL (ref 3.8–10.8)

## 2019-09-25 LAB — IRON,TIBC AND FERRITIN PANEL
%SAT: 46 % (calc) (ref 20–48)
Ferritin: 292 ng/mL (ref 38–380)
Iron: 129 ug/dL (ref 50–180)
TIBC: 280 mcg/dL (calc) (ref 250–425)

## 2019-09-25 NOTE — Progress Notes (Signed)
Hemoglobin slightly low at 13.1. Iron panel within normal limits. Recommend we proceed with TCS alone as he has no evidence of iron deficiency anemia or other significant upper GI symptoms.   [facility] Clinical Pool: Please arrange TCS with propofol with Dr. Jena Gauss. Dx: colon cancer screening.

## 2019-10-15 ENCOUNTER — Other Ambulatory Visit (HOSPITAL_COMMUNITY): Payer: BLUE CROSS/BLUE SHIELD

## 2019-10-15 ENCOUNTER — Telehealth: Payer: Self-pay | Admitting: *Deleted

## 2019-10-15 NOTE — Telephone Encounter (Signed)
Patient needs TCS with propofol with RMR. Please advise ASA type thanks

## 2019-10-15 NOTE — Telephone Encounter (Signed)
Due to history of alcohol use with report of 12 beer per week, will err on side of caution with ASA III as I am not sure how anesthesia feels about this. Alcohol dependence places patients in ASA III category.

## 2019-10-16 ENCOUNTER — Encounter: Payer: Self-pay | Admitting: *Deleted

## 2019-10-16 NOTE — Telephone Encounter (Signed)
Called pt. He is scheduled for TCS with propofol with Dr. Marletta Lor 8/10 at 12:15pm. Pt aware will need a pre-op/covid test. Will mail this appt with his instructions. Confirmed mailing address is correct. Advised will do miralax prep OTC/

## 2019-10-30 ENCOUNTER — Other Ambulatory Visit: Payer: Self-pay

## 2019-10-30 ENCOUNTER — Encounter: Payer: Self-pay | Admitting: General Surgery

## 2019-10-30 ENCOUNTER — Ambulatory Visit (INDEPENDENT_AMBULATORY_CARE_PROVIDER_SITE_OTHER): Payer: BC Managed Care – PPO | Admitting: General Surgery

## 2019-10-30 VITALS — BP 125/73 | HR 52 | Temp 98.4°F | Resp 14 | Ht 70.0 in | Wt 163.0 lb

## 2019-10-30 DIAGNOSIS — K4090 Unilateral inguinal hernia, without obstruction or gangrene, not specified as recurrent: Secondary | ICD-10-CM | POA: Diagnosis not present

## 2019-10-30 NOTE — Progress Notes (Signed)
Keith Adkins; [id number]; [birth year]   HPI Patient is a 49yo wm who was referred to my care by Dr. Sudie Bailey for evaluation and treatment of a left inguinal hernia.  Has been present for some time, but recently has noticed more of a lump.  Made worse with straining.  No pain noted. Past Medical History:  Diagnosis Date   HTN (hypertension)     History reviewed. No pertinent surgical history.  Family History  Problem Relation Age of Onset   Colon cancer Neg Hx     Current Outpatient Medications on File Prior to Visit  Medication Sig Dispense Refill   lisinopril-hydrochlorothiazide (ZESTORETIC) 20-25 MG tablet Take 1 tablet by mouth every morning.     No current facility-administered medications on file prior to visit.    No Known Allergies  Social History   Substance and Sexual Activity  Alcohol Use Yes   Alcohol/week: 12.0 standard drinks   Types: 12 Cans of beer per week   Comment: 12 beer per week    Social History   Tobacco Use  Smoking Status Former Smoker  Smokeless Tobacco Never Used    Review of Systems  Constitutional: Negative.   HENT: Negative.   Eyes: Negative.   Respiratory: Negative.   Cardiovascular: Negative.   Gastrointestinal: Negative.   Genitourinary: Negative.   Musculoskeletal: Negative.   Skin: Negative.   Neurological: Negative.   Endo/Heme/Allergies: Negative.   Psychiatric/Behavioral: Negative.     Objective   Vitals:   10/30/19 1500  BP: 125/73  Pulse: 52  Resp: 14  Temp: 98.4 F (36.9 C)  SpO2: 97%    Physical Exam Vitals reviewed.  Constitutional:      Appearance: Normal appearance. He is normal weight. He is not ill-appearing.  HENT:     Head: Normocephalic and atraumatic.  Cardiovascular:     Rate and Rhythm: Normal rate and regular rhythm.     Heart sounds: Normal heart sounds. No murmur heard.  No friction rub. No gallop.   Pulmonary:     Effort: Pulmonary effort is normal. No respiratory distress.      Breath sounds: Normal breath sounds. No stridor. No wheezing, rhonchi or rales.  Abdominal:     General: Abdomen is flat. Bowel sounds are normal. There is no distension.     Palpations: Abdomen is soft. There is no mass.     Tenderness: There is no abdominal tenderness. There is no guarding or rebound.     Hernia: A hernia is present.     Comments: Direct reducible left inguinal hernia.  Genitourinary:    Comments: Only has left testicle.  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 Skin:    General: Skin is warm and dry.  Neurological:     Mental Status: He is alert and oriented to person, place, and time.    PCP notes reviewed. Assessment  Left inguinal hernia, currently asymptomatic Plan   As the hernia is easily reducible, risk of incarceration is low.  Patient is currently asymptomatic.  Does not want repair unless absolutely necessary, which is fine with me.  Literature given concerning inguinal hernias.  Follow up here should he become more symptomatic.

## 2019-10-30 NOTE — Patient Instructions (Signed)

## 2019-11-09 NOTE — Patient Instructions (Signed)
   Your procedure is scheduled on: 11/13/2019  Report to [facility] at   10:45  AM.  Call this number if you have problems the morning of surgery: [phone]   Remember:              Follow Directions on the letter you received from Your Physician's office regarding the Bowel Prep              No Smoking the day of Procedure :   Take these medicines the morning of surgery with A SIP OF WATER: none  Do not wear jewelry, make-up or nail polish.    Do not bring valuables to the hospital.  Contacts, dentures or bridgework may not be worn into surgery.  .   Patients discharged the day of surgery will not be allowed to drive home.     Colonoscopy, Adult, Care After This sheet gives you information about how to care for yourself after your procedure. Your health care provider may also give you more specific instructions. If you have problems or questions, contact your health care provider. What can I expect after the procedure? After the procedure, it is common to have:  A small amount of blood in your stool for 24 hours after the procedure.  Some gas.  Mild abdominal cramping or bloating.  Follow these instructions at home: General instructions   For the first 24 hours after the procedure: ? Do not drive or use machinery. ? Do not sign important documents. ? Do not drink alcohol. ? Do your regular daily activities at a slower pace than normal. ? Eat soft, easy-to-digest foods. ? Rest often.  Take over-the-counter or prescription medicines only as told by your health care provider.  It is up to you to get the results of your procedure. Ask your health care provider, or the department performing the procedure, when your results will be ready. Relieving cramping and bloating  Try walking around when you have cramps or feel bloated.  Apply heat to your abdomen as told by your health care provider. Use a heat source that your health care provider recommends, such as a moist  heat pack or a heating pad. ? Place a towel between your skin and the heat source. ? Leave the heat on for 20-30 minutes. ? Remove the heat if your skin turns bright red. This is especially important if you are unable to feel pain, heat, or cold. You may have a greater risk of getting burned. Eating and drinking  Drink enough fluid to keep your urine clear or pale yellow.  Resume your normal diet as instructed by your health care provider. Avoid heavy or fried foods that are hard to digest.  Avoid drinking alcohol for as long as instructed by your health care provider. Contact a health care provider if:  You have blood in your stool 2-3 days after the procedure. Get help right away if:  You have more than a small spotting of blood in your stool.  You pass large blood clots in your stool.  Your abdomen is swollen.  You have nausea or vomiting.  You have a fever.  You have increasing abdominal pain that is not relieved with medicine. This information is not intended to replace advice given to you by your health care provider. Make sure you discuss any questions you have with your health care provider. Document Released: 11/04/2003 Document Revised: 12/15/2015 Document Reviewed: 06/03/2015 Elsevier Interactive Patient Education  [employer].

## 2019-11-12 ENCOUNTER — Other Ambulatory Visit: Payer: Self-pay

## 2019-11-12 ENCOUNTER — Encounter (HOSPITAL_COMMUNITY): Payer: Self-pay

## 2019-11-12 ENCOUNTER — Encounter (HOSPITAL_COMMUNITY)
Admission: RE | Admit: 2019-11-12 | Discharge: 2019-11-12 | Disposition: A | Discharge status: Home / Self Care | Payer: BC Managed Care – PPO | Source: Ambulatory Visit | Attending: Internal Medicine | Admitting: Internal Medicine

## 2019-11-12 ENCOUNTER — Other Ambulatory Visit (HOSPITAL_COMMUNITY)
Admission: RE | Admit: 2019-11-12 | Discharge: 2019-11-12 | Disposition: A | Discharge status: Home / Self Care | Payer: BC Managed Care – PPO | Source: Ambulatory Visit | Attending: Internal Medicine | Admitting: Internal Medicine

## 2019-11-12 DIAGNOSIS — I10 Essential (primary) hypertension: Secondary | ICD-10-CM | POA: Diagnosis not present

## 2019-11-12 DIAGNOSIS — Z01812 Encounter for preprocedural laboratory examination: Secondary | ICD-10-CM | POA: Diagnosis not present

## 2019-11-12 DIAGNOSIS — Z79899 Other long term (current) drug therapy: Secondary | ICD-10-CM | POA: Diagnosis not present

## 2019-11-12 DIAGNOSIS — Z20822 Contact with and (suspected) exposure to covid-19: Secondary | ICD-10-CM | POA: Diagnosis not present

## 2019-11-12 DIAGNOSIS — K648 Other hemorrhoids: Secondary | ICD-10-CM | POA: Diagnosis not present

## 2019-11-12 DIAGNOSIS — Z87891 Personal history of nicotine dependence: Secondary | ICD-10-CM | POA: Diagnosis not present

## 2019-11-12 DIAGNOSIS — Z1211 Encounter for screening for malignant neoplasm of colon: Secondary | ICD-10-CM | POA: Diagnosis present

## 2019-11-12 LAB — BASIC METABOLIC PANEL
Anion gap: 10 (ref 5–15)
BUN: 16 mg/dL (ref 6–20)
CO2: 25 mmol/L (ref 22–32)
Calcium: 9.6 mg/dL (ref 8.9–10.3)
Chloride: 101 mmol/L (ref 98–111)
Creatinine, Ser: 0.88 mg/dL (ref 0.61–1.24)
GFR calc Af Amer: >60 mL/min (ref >60)
GFR calc non Af Amer: >60 mL/min (ref >60)
Glucose, Bld: 106 mg/dL — ABNORMAL HIGH (ref 70–99)
Potassium: 4.1 mmol/L (ref 3.5–5.1)
Sodium: 136 mmol/L (ref 135–145)

## 2019-11-12 LAB — SARS CORONAVIRUS 2 (TAT 6-24 HRS): SARS Coronavirus 2: NEGATIVE

## 2019-11-13 ENCOUNTER — Encounter (HOSPITAL_COMMUNITY)
Admission: RE | Disposition: A | Discharge status: Home / Self Care | Payer: Self-pay | Source: Home / Self Care | Attending: Internal Medicine

## 2019-11-13 ENCOUNTER — Ambulatory Visit (HOSPITAL_COMMUNITY): Payer: BC Managed Care – PPO | Admitting: Certified Registered"

## 2019-11-13 ENCOUNTER — Encounter (HOSPITAL_COMMUNITY): Payer: Self-pay | Admitting: *Deleted

## 2019-11-13 ENCOUNTER — Ambulatory Visit (HOSPITAL_COMMUNITY)
Admission: RE | Admit: 2019-11-13 | Discharge: 2019-11-13 | Disposition: A | Discharge status: Home / Self Care | Payer: BC Managed Care – PPO | Attending: Internal Medicine | Admitting: Internal Medicine

## 2019-11-13 DIAGNOSIS — I10 Essential (primary) hypertension: Secondary | ICD-10-CM | POA: Insufficient documentation

## 2019-11-13 DIAGNOSIS — Z1211 Encounter for screening for malignant neoplasm of colon: Secondary | ICD-10-CM | POA: Diagnosis not present

## 2019-11-13 DIAGNOSIS — K648 Other hemorrhoids: Secondary | ICD-10-CM | POA: Insufficient documentation

## 2019-11-13 DIAGNOSIS — Z87891 Personal history of nicotine dependence: Secondary | ICD-10-CM | POA: Diagnosis not present

## 2019-11-13 DIAGNOSIS — Z79899 Other long term (current) drug therapy: Secondary | ICD-10-CM | POA: Insufficient documentation

## 2019-11-13 HISTORY — PX: COLONOSCOPY WITH PROPOFOL: SHX5780

## 2019-11-13 SURGERY — COLONOSCOPY WITH PROPOFOL
Anesthesia: General

## 2019-11-13 MED ORDER — LACTATED RINGERS IV SOLN
INTRAVENOUS | Status: DC
  Administered 2019-11-13: 1000 mL via INTRAVENOUS

## 2019-11-13 MED ORDER — STERILE WATER FOR IRRIGATION IR SOLN
Status: DC | PRN
  Administered 2019-11-13: 1.5 mL

## 2019-11-13 MED ORDER — KETAMINE HCL 50 MG/5ML IJ SOSY
PREFILLED_SYRINGE | INTRAMUSCULAR | Status: AC
  Filled 2019-11-13: qty 5

## 2019-11-13 MED ORDER — EPHEDRINE SULFATE 50 MG/ML IJ SOLN
INTRAMUSCULAR | Status: DC | PRN
  Administered 2019-11-13: 10 mg via INTRAVENOUS

## 2019-11-13 MED ORDER — CHLORHEXIDINE GLUCONATE CLOTH 2 % EX PADS: 6.0000 | MEDICATED_PAD | Freq: Once | CUTANEOUS | Status: DC

## 2019-11-13 MED ORDER — PROPOFOL 10 MG/ML IV BOLUS
INTRAVENOUS | Status: AC
  Filled 2019-11-13: qty 20

## 2019-11-13 MED ORDER — KETAMINE HCL 10 MG/ML IJ SOLN
INTRAMUSCULAR | Status: DC | PRN
  Administered 2019-11-13: 20 mg via INTRAVENOUS

## 2019-11-13 MED ORDER — PROPOFOL 10 MG/ML IV BOLUS
INTRAVENOUS | Status: DC | PRN
  Administered 2019-11-13 (×2): 20 mg via INTRAVENOUS
  Administered 2019-11-13: 40 mg via INTRAVENOUS
  Administered 2019-11-13: 20 mg via INTRAVENOUS
  Administered 2019-11-13: 40 mg via INTRAVENOUS
  Administered 2019-11-13 (×3): 20 mg via INTRAVENOUS
  Administered 2019-11-13: 60 mg via INTRAVENOUS
  Administered 2019-11-13: 20 mg via INTRAVENOUS

## 2019-11-13 NOTE — Anesthesia Preprocedure Evaluation (Signed)
Anesthesia Evaluation  Patient identified by MRN, date of birth, ID band Patient awake    Reviewed: Allergy & Precautions, H&P , NPO status , Patient's Chart, lab work & pertinent test results, reviewed documented beta blocker date and time   Airway Mallampati: I  TM Distance: >3 FB Neck ROM: full    Dental no notable dental hx.    Pulmonary neg pulmonary ROS, former smoker,    Pulmonary exam normal breath sounds clear to auscultation       Cardiovascular Exercise Tolerance: Good hypertension, negative cardio ROS   Rhythm:regular Rate:Normal     Neuro/Psych negative neurological ROS  negative psych ROS   GI/Hepatic negative GI ROS, Neg liver ROS,   Endo/Other  negative endocrine ROS  Renal/GU negative Renal ROS  negative genitourinary   Musculoskeletal   Abdominal   Peds  Hematology negative hematology ROS (+)   Anesthesia Other Findings   Reproductive/Obstetrics negative OB ROS                             Anesthesia Physical Anesthesia Plan  ASA: II  Anesthesia Plan: General   Post-op Pain Management:    Induction:   PONV Risk Score and Plan: Propofol infusion  Airway Management Planned:   Additional Equipment:   Intra-op Plan:   Post-operative Plan:   Informed Consent: I have reviewed the patients History and Physical, chart, labs and discussed the procedure including the risks, benefits and alternatives for the proposed anesthesia with the patient or authorized representative who has indicated his/her understanding and acceptance.     Dental Advisory Given  Plan Discussed with: CRNA  Anesthesia Plan Comments:         Anesthesia Quick Evaluation

## 2019-11-13 NOTE — H&P (Signed)
Referring Provider: No ref. provider found Primary Care Physician:  Gareth Morgan, MD Primary Gastroenterologist:  Dr.  Sula Rumple:  Keith Adkins is a 49 y.o. male who presents for outpatient screening colonoscopy   Past Medical History:  Diagnosis Date  . HTN (hypertension)     No past surgical history on file.  Prior to Admission medications   Medication Sig Start Date End Date Taking? Authorizing Provider  lisinopril-hydrochlorothiazide (ZESTORETIC) 20-25 MG tablet Take 1 tablet by mouth every morning. 04/09/19  Yes [provider]    No current facility-administered medications for this encounter.    Allergies as of 10/16/2019  . (No Known Allergies)    Family History  Problem Relation Age of Onset  . Colon cancer Neg Hx     Social History   Socioeconomic History  . Marital status: Married    Spouse name: Not on file  . Number of children: Not on file  . Years of education: Not on file  . Highest education level: Not on file  Occupational History  . Not on file  Tobacco Use  . Smoking status: Former Smoker    Packs/day: 1.00    Years: 12.00    Pack years: 12.00    Quit date: 11/12/1999    Years since quitting: 20.0  . Smokeless tobacco: Never Used  Vaping Use  . Vaping Use: Never used  Substance and Sexual Activity  . Alcohol use: Yes    Alcohol/week: 12.0 standard drinks    Types: 12 Cans of beer per week    Comment: 12 beer per week  . Drug use: Never  . Sexual activity: Yes  Other Topics Concern  . Not on file  Social History Narrative  . Not on file   Social Determinants of Health   Financial Resource Strain:   . Difficulty of Paying Living Expenses:   Food Insecurity:   . Worried About Programme researcher, broadcasting/film/video in the Last Year:   . Barista in the Last Year:   Transportation Needs:   . Freight forwarder (Medical):   Marland Kitchen Lack of Transportation (Non-Medical):   Physical Activity:   . Days of Exercise per Week:   . Minutes of  Exercise per Session:   Stress:   . Feeling of Stress :   Social Connections:   . Frequency of Communication with Friends and Family:   . Frequency of Social Gatherings with Friends and Family:   . Attends Religious Services:   . Active Member of Clubs or Organizations:   . Attends Banker Meetings:   Marland Kitchen Marital Status:   Intimate Partner Violence:   . Fear of Current or Ex-Partner:   . Emotionally Abused:   Marland Kitchen Physically Abused:   . Sexually Abused:     Review of Systems: General: Negative for anorexia, weight loss, fever, chills, fatigue, weakness. Eyes: Negative for vision changes.  ENT: Negative for hoarseness, difficulty swallowing , nasal congestion. CV: Negative for chest pain, angina, palpitations, dyspnea on exertion, peripheral edema.  Respiratory: Negative for dyspnea at rest, dyspnea on exertion, cough, sputum, wheezing.  GI: See history of present illness. GU:  Negative for dysuria, hematuria, urinary incontinence, urinary frequency, nocturnal urination.  MS: Negative for joint pain, low back pain.  Derm: Negative for rash or itching.  Neuro: Negative for weakness, abnormal sensation, seizure, frequent headaches, memory loss, confusion.  Psych: Negative for anxiety, depression, suicidal ideation, hallucinations.  Endo: Negative for unusual weight change.  Heme: Negative for bruising or bleeding. Allergy: Negative for rash or hives.  Physical Exam: Vital signs in last 24 hours: Temp:  [97.6 F (36.4 C)] 97.6 F (36.4 C) (08/09 1044) Pulse Rate:  [43] 43 (08/09 1044) Resp:  [18] 18 (08/09 1044) BP: (131)/(76) 131/76 (08/09 1044) SpO2:  [100 %] 100 % (08/09 1044) Weight:  [74.8 kg] 74.8 kg (08/09 1044)   General:   Alert,  Well-developed, well-nourished, pleasant and cooperative in NAD Head:  Normocephalic and atraumatic. Eyes:  Sclera clear, no icterus.   Conjunctiva pink. Ears:  Normal auditory acuity. Nose:  No deformity, discharge,  or  lesions. Mouth:  No deformity or lesions, dentition normal. Neck:  Supple; no masses or thyromegaly. Lungs:  Clear throughout to auscultation.   No wheezes, crackles, or rhonchi. No acute distress. Heart:  Regular rate and rhythm; no murmurs, clicks, rubs,  or gallops. Abdomen:  Soft, nontender and nondistended. No masses, hepatosplenomegaly or hernias noted. Normal bowel sounds, without guarding, and without rebound.   Rectal:  Deferred until time of colonoscopy.   Msk:  Symmetrical without gross deformities. Normal posture. Pulses:  Normal pulses noted. Extremities:  Without clubbing or edema. Neurologic:  Alert and  oriented x4;  grossly normal neurologically. Skin:  Intact without significant lesions or rashes. Cervical Nodes:  No significant cervical adenopathy. Psych:  Alert and cooperative. Normal mood and affect.  Intake/Output from previous day: No intake/output data recorded. Intake/Output this shift: No intake/output data recorded.  Lab Results: No results for input(s): WBC, HGB, HCT, PLT in the last 72 hours. BMET Recent Labs    11/12/19 1051  NA 136  K 4.1  CL 101  CO2 25  GLUCOSE 106*  BUN 16  CREATININE 0.88  CALCIUM 9.6   LFT No results for input(s): PROT, ALBUMIN, AST, ALT, ALKPHOS, BILITOT, BILIDIR, IBILI in the last 72 hours. PT/INR No results for input(s): LABPROT, INR in the last 72 hours. Hepatitis Panel No results for input(s): HEPBSAG, HCVAB, HEPAIGM, HEPBIGM in the last 72 hours. C-Diff No results for input(s): CDIFFTOX in the last 72 hours.  Studies/Results: No results found.  Impression: Colon cancer screening  Plan: Proceed with screening colonoscopy    LOS: 0 days     11/13/2019, 10:43 AM

## 2019-11-13 NOTE — Op Note (Signed)
[facility] Patient Name: Keith Adkins Procedure Date: 11/13/2019 11:46 AM MRN: [id number] Date of Birth: 30-Jan-1971 Attending MD: Elon Alas. Abbey Chatters DO CSN: [id number] Age: 49 Admit Type: Outpatient Procedure:                Colonoscopy Indications:              Screening for colorectal malignant neoplasm Providers:                Elon Alas. Catalea Labrecque, DO, Otis Peak B. Sharon Seller, RN,                            Caprice Kluver, Aram Candela Referring MD:              Medicines:                See the Anesthesia note for documentation of the                            administered medications Complications:            No immediate complications. Estimated Blood Loss:     Estimated blood loss: none. Procedure:                Pre-Anesthesia Assessment:                           - The anesthesia plan was to use monitored                            anesthesia care (MAC).                           After obtaining informed consent, the colonoscope                            was passed under direct vision. Throughout the                            procedure, the patient's blood pressure, pulse, and                            oxygen saturations were monitored continuously. The                            [id number]-[id number] ([id number]) scope was introduced through                            the anus and advanced to the the terminal ileum,                            with identification of the appendiceal orifice and                            IC valve. The colonoscopy was performed without                            difficulty. The patient  tolerated the procedure                            well. The quality of the bowel preparation was                            evaluated using the BBPS [facility] Bowel Preparation                            Scale) with scores of: Right Colon = 2 (minor                            amount of residual staining, small fragments of                            stool and/or opaque  liquid, but mucosa seen well),                            Transverse Colon = 3 (entire mucosa seen well with                            no residual staining, small fragments of stool or                            opaque liquid) and Left Colon = 3 (entire mucosa                            seen well with no residual staining, small                            fragments of stool or opaque liquid). The total                            BBPS score equals 8. The quality of the bowel                            preparation was good. Scope In: 12:05:13 PM Scope Out: 12:18:55 PM Scope Withdrawal Time: 0 hours 9 minutes 55 seconds  Total Procedure Duration: 0 hours 13 minutes 42 seconds  Findings:      The perianal and digital rectal examinations were normal.      Non-bleeding internal hemorrhoids were found during endoscopy.      The terminal ileum appeared normal.      The exam was otherwise without abnormality. Impression:               - Non-bleeding internal hemorrhoids.                           - The examined portion of the ileum was normal.                           - The examination was otherwise normal.                           -  No specimens collected. Moderate Sedation:      Per Anesthesia Care Recommendation:           - Patient has a contact number available for                            emergencies. The signs and symptoms of potential                            delayed complications were discussed with the                            patient. Return to normal activities tomorrow.                            Written discharge instructions were provided to the                            patient.                           - Resume previous diet.                           - Continue present medications.                           - Repeat colonoscopy in 10 years for screening                            purposes.                           - Return to GI clinic PRN. Procedure Code(s):         --- Professional ---                           W4097, Colorectal cancer screening; colonoscopy on                            individual not meeting criteria for high risk Diagnosis Code(s):        --- Professional ---                           Z12.11, Encounter for screening for malignant                            neoplasm of colon                           K64.8, Other hemorrhoids CPT copyright 2019 American Medical Association. All rights reserved. The codes documented in this report are preliminary and upon coder review may  be revised to meet current compliance requirements. Elon Alas. Abbey Chatters, [place] Abbey Chatters, DO 11/13/2019 12:23:27 PM This report has been signed electronically. Number of Addenda: 0

## 2019-11-13 NOTE — Transfer of Care (Signed)
Immediate Anesthesia Transfer of Care Note  Patient: Keith Adkins  Procedure(s) Performed: COLONOSCOPY WITH PROPOFOL (N/A )  Patient Location: PACU  Anesthesia Type:MAC  Level of Consciousness: awake, alert , oriented and patient cooperative  Airway & Oxygen Therapy: Patient Spontanous Breathing and Patient connected to nasal cannula oxygen  Post-op Assessment: Report given to RN, Post -op Vital signs reviewed and stable and Patient moving all extremities X 4  Post vital signs: Reviewed and stable  Last Vitals:  Vitals Value Taken Time  BP    Temp 36.4 C 11/13/19 1224  Pulse 49 11/13/19 1224  Resp 16 11/13/19 1224  SpO2 99 % 11/13/19 1223  Vitals shown include unvalidated device data.  Last Pain:  Vitals:   11/13/19 1157  TempSrc:   PainSc: 0-No pain      Patients Stated Pain Goal: 7 (55/21/74 7159)  Complications: No complications documented.

## 2019-11-13 NOTE — Anesthesia Postprocedure Evaluation (Signed)
Anesthesia Post Note  Patient: Keith Adkins  Procedure(s) Performed: COLONOSCOPY WITH PROPOFOL (N/A )  Patient location during evaluation: PACU Anesthesia Type: General Level of consciousness: awake and alert Pain management: pain level controlled Vital Signs Assessment: post-procedure vital signs reviewed and stable Respiratory status: spontaneous breathing, nonlabored ventilation, respiratory function stable and patient connected to nasal cannula oxygen Cardiovascular status: stable and blood pressure returned to baseline Postop Assessment: no apparent nausea or vomiting Anesthetic complications: no   No complications documented.   Last Vitals:  Vitals:   11/13/19 1300 11/13/19 1309  BP: (!) 145/82 132/78  Pulse: (!) 43 82  Resp: 16 14  Temp:  36.6 C  SpO2: 100%     Last Pain:  Vitals:   11/13/19 1309  TempSrc: Oral  PainSc: 0-No pain                 Joanthan Hlavacek

## 2019-11-13 NOTE — Progress Notes (Signed)
Pt normally bradycardic

## 2019-11-13 NOTE — Discharge Instructions (Addendum)
Standard post op colonoscopy instructions  No polyps found, repeat screening colonoscopy in 10 years  Follow up with GI as needed        Colonoscopy, Adult, Care After This sheet gives you information about how to care for yourself after your procedure. Your doctor may also give you more specific instructions. If you have problems or questions, call your doctor. What can I expect after the procedure? After the procedure, it is common to have:  A small amount of blood in your poop (stool) for 24 hours.  Some gas.  Mild cramping or bloating in your belly (abdomen). Follow these instructions at home: Eating and drinking   Drink enough fluid to keep your pee (urine) pale yellow.  Follow instructions from your doctor about what you cannot eat or drink.  Return to your normal diet as told by your doctor. Avoid heavy or fried foods that are hard to digest. Activity  Rest as told by your doctor.  Do not sit for a long time without moving. Get up to take short walks every 1-2 hours. This is important. Ask for help if you feel weak or unsteady.  Return to your normal activities as told by your doctor. Ask your doctor what activities are safe for you. To help cramping and bloating:   Try walking around.  Put heat on your belly as told by your doctor. Use the heat source that your doctor recommends, such as a moist heat pack or a heating pad. ? Put a towel between your skin and the heat source. ? Leave the heat on for 20-30 minutes. ? Remove the heat if your skin turns bright red. This is very important if you are unable to feel pain, heat, or cold. You may have a greater risk of getting burned. General instructions  For the first 24 hours after the procedure: ? Do not drive or use machinery. ? Do not sign important documents. ? Do not drink alcohol. ? Do your daily activities more slowly than normal. ? Eat foods that are soft and easy to digest.  Take over-the-counter or  prescription medicines only as told by your doctor.  Keep all follow-up visits as told by your doctor. This is important. Contact a doctor if:  You have blood in your poop 2-3 days after the procedure. Get help right away if:  You have more than a small amount of blood in your poop.  You see large clumps of tissue (blood clots) in your poop.  Your belly is swollen.  You feel like you may vomit (nauseous).  You vomit.  You have a fever.  You have belly pain that gets worse, and medicine does not help your pain. Summary  After the procedure, it is common to have a small amount of blood in your poop. You may also have mild cramping and bloating in your belly.  For the first 24 hours after the procedure, do not drive or use machinery, do not sign important documents, and do not drink alcohol.  Get help right away if you have a lot of blood in your poop, feel like you may vomit, have a fever, or have more belly pain. This information is not intended to replace advice given to you by your health care provider. Make sure you discuss any questions you have with your health care provider. Document Revised: 10/16/2018 Document Reviewed: 10/16/2018 Elsevier Patient Education  2020 [employer]    Monitored Anesthesia Care, Care After These instructions provide  you with information about caring for yourself after your procedure. Your health care provider may also give you more specific instructions. Your treatment has been planned according to current medical practices, but problems sometimes occur. Call your health care provider if you have any problems or questions after your procedure. What can I expect after the procedure? After your procedure, you may:  Feel sleepy for several hours.  Feel clumsy and have poor balance for several hours.  Feel forgetful about what happened after the procedure.  Have poor judgment for several hours.  Feel nauseous or vomit.  Have a sore  throat if you had a breathing tube during the procedure. Follow these instructions at home: For at least 24 hours after the procedure:      Have a responsible adult stay with you. It is important to have someone help care for you until you are awake and alert.  Rest as needed.  Do not: ? Participate in activities in which you could fall or become injured. ? Drive. ? Use heavy machinery. ? Drink alcohol. ? Take sleeping pills or medicines that cause drowsiness. ? Make important decisions or sign legal documents. ? Take care of children on your own. Eating and drinking  Follow the diet that is recommended by your health care provider.  If you vomit, drink water, juice, or soup when you can drink without vomiting.  Make sure you have little or no nausea before eating solid foods. General instructions  Take over-the-counter and prescription medicines only as told by your health care provider.  If you have sleep apnea, surgery and certain medicines can increase your risk for breathing problems. Follow instructions from your health care provider about wearing your sleep device: ? Anytime you are sleeping, including during daytime naps. ? While taking prescription pain medicines, sleeping medicines, or medicines that make you drowsy.  If you smoke, do not smoke without supervision.  Keep all follow-up visits as told by your health care provider. This is important. Contact a health care provider if:  You keep feeling nauseous or you keep vomiting.  You feel light-headed.  You develop a rash.  You have a fever. Get help right away if:  You have trouble breathing. Summary  For several hours after your procedure, you may feel sleepy and have poor judgment.  Have a responsible adult stay with you for at least 24 hours or until you are awake and alert. This information is not intended to replace advice given to you by your health care provider. Make sure you discuss any  questions you have with your health care provider. Document Revised: 06/20/2017 Document Reviewed: 07/13/2015 Elsevier Patient Education  2020 [employer].

## 2019-11-16 ENCOUNTER — Encounter (HOSPITAL_COMMUNITY): Payer: Self-pay | Admitting: Internal Medicine

## 2020-11-03 ENCOUNTER — Ambulatory Visit: Payer: No Typology Code available for payment source

## 2020-11-03 ENCOUNTER — Ambulatory Visit: Payer: No Typology Code available for payment source | Admitting: Orthopedic Surgery

## 2020-11-03 ENCOUNTER — Encounter: Payer: Self-pay | Admitting: Orthopedic Surgery

## 2020-11-03 ENCOUNTER — Other Ambulatory Visit: Payer: Self-pay

## 2020-11-03 VITALS — BP 142/77 | HR 46 | Ht 70.0 in | Wt 172.4 lb

## 2020-11-03 DIAGNOSIS — M25562 Pain in left knee: Secondary | ICD-10-CM

## 2020-11-03 DIAGNOSIS — G8929 Other chronic pain: Secondary | ICD-10-CM | POA: Diagnosis not present

## 2020-11-03 NOTE — Progress Notes (Signed)
dg 

## 2020-11-03 NOTE — Progress Notes (Signed)
NEW PROBLEM//OFFICE VISIT  Summary assessment and plan:   Encounter Diagnoses  Name Primary?   Chronic pain of left knee    Knee pain, left anterior Yes    Recommend quadricep strengthening exercises and a knee sleeve I do not think there is much we can do probably has a retropatellar contusion and I explained this to him  Chief Complaint  Patient presents with   Knee Pain    L/DOI 03/2020/Training for a marathon and tripped over baby gate and hit knee on door corner. Didn't see anyone for it    50 year old male 69 to 20 miles a week runner training for marathon in December tripped and hit his knee against a door jam now complains of left anterior knee pain since that time.  He says he cannot sit in a car for more than 45 minutes does not like to have his knee bent has some pain when running but he says he can run through it    MEDICAL DECISION MAKING  A.  Encounter Diagnoses  Name Primary?   Chronic pain of left knee    Knee pain, left anterior Yes    B. DATA ANALYSED:   IMAGING: Interpretation of images: Images show slight narrowing medial compartment normal patellar tracking  Orders: None  Outside records reviewed: None   C. MANAGEMENT   As above  No orders of the defined types were placed in this encounter.    BP (!) 142/77   Pulse (!) 46   Ht 5\' 10"  (1.778 m)   Wt 172 lb 6 oz (78.2 kg)   BMI 24.73 kg/m    General appearance: Well-developed well-nourished no gross deformities  Cardiovascular normal pulse and perfusion normal color without edema  Neurologically no sensation loss or deficits or pathologic reflexes  Psychological: Awake alert and oriented x3 mood and affect normal  Skin no lacerations or ulcerations no nodularity no palpable masses, no erythema or nodularity  Musculoskeletal: Positive patellofemoral compression test tenderness on the lateral and medial facet otherwise normal exam   ROS Negative  Past Medical History:   Diagnosis Date   HTN (hypertension)     Past Surgical History:  Procedure Laterality Date   COLONOSCOPY WITH PROPOFOL N/A 11/13/2019   Procedure: COLONOSCOPY WITH PROPOFOL;  Surgeon: 01/13/2020, DO;  Location: [facility];  Service: Endoscopy;  Laterality: N/A;  12:15pm   ORCHIECTOMY Right    age 36    Family History  Problem Relation Age of Onset   Colon cancer Neg Hx    Social History   Tobacco Use   Smoking status: Former    Packs/day: 1.00    Years: 12.00    Pack years: 12.00    Types: Cigarettes    Quit date: 11/12/1999    Years since quitting: 20.9   Smokeless tobacco: Never  Vaping Use   Vaping Use: Never used  Substance Use Topics   Alcohol use: Yes    Alcohol/week: 12.0 standard drinks    Types: 12 Cans of beer per week    Comment: 12 beer per week   Drug use: Never    No Known Allergies  Current Meds  Medication Sig   ibuprofen (ADVIL) 800 MG tablet Take by mouth.   lisinopril-hydrochlorothiazide (ZESTORETIC) 20-25 MG tablet Take 1 tablet by mouth every morning.        01/12/2000, MD  11/03/2020 11:20 AM

## 2022-11-23 NOTE — Progress Notes (Unsigned)
Name: Keith Adkins DOB: 01-14-1971 MRN: [id number]  History of Present Illness: Mr. Mollner is a 52 y.o. male who presents today as a new patient at [facility] [place]. All available relevant medical records have been reviewed.   Per outside records brought by patient: - History of varicocele (per prior scrotal / testicular ultrasound), cryptorchidism, and male infertility. - Had right orchiectomy around age 69 (58) due to undescended testicle. - Previously seen at [facility]. Per note there on 06/02/2009: Semen analysis revealed no sperm.   Today: He reports that over the past few months he has been having increased urinary urgency, frequency, nocturia, and terminal dribbling. Then about 2 weeks he had an episode of gross hematuria with dysuria; that lasted for 1 day and has not recurred since then. He took some AZO and that was helpful.   Reports mild straining to void. Denies urinary incontinence, hesitancy, or sensations of incomplete emptying. He reports normal urinary stream. He denies abdominal or flank pain.  Denies family history of prostate cancer.  He denies prior history of gross hematuria. He denies history of kidney stones.  He denies history of pyelonephritis.  He denies history of recent or recurrent UTI. He denies history of GU malignancy or pelvic radiation.  He denies history of autoimmune disease. He reports history of smoking (quit in 2001; smoked 1 ppd x12 years). He denies known occupational risks. He denies recent vigorous exercise which they think may be contributory to hematuria. He denies any recent trauma or prolonged pressure to the perineal area. He denies recent illness. He denies taking anticoagulants.   Fall Screening: Do you usually have a device to assist in your mobility? No   Medications: Current Outpatient Medications  Medication Sig Dispense Refill   finasteride (PROPECIA) 1 MG tablet Take 1 mg by mouth daily.      ibuprofen (ADVIL) 800 MG tablet Take by mouth.     lisinopril-hydrochlorothiazide (ZESTORETIC) 20-25 MG tablet Take 1 tablet by mouth every morning.     tamsulosin (FLOMAX) 0.4 MG CAPS capsule Take 1 capsule (0.4 mg total) by mouth daily. 30 capsule 11   No current facility-administered medications for this visit.    Allergies: No Known Allergies  Past Medical History:  Diagnosis Date   HTN (hypertension)    Past Surgical History:  Procedure Laterality Date   COLONOSCOPY WITH PROPOFOL N/A 11/13/2019   Procedure: COLONOSCOPY WITH PROPOFOL;  Surgeon: Lanelle Bal, DO;  Location: [facility];  Service: Endoscopy;  Laterality: N/A;  12:15pm   ORCHIECTOMY Right    age 14   Family History  Problem Relation Age of Onset   Colon cancer Neg Hx    Social History   Socioeconomic History   Marital status: Divorced    Spouse name: Not on file   Number of children: Not on file   Years of education: Not on file   Highest education level: Not on file  Occupational History   Not on file  Tobacco Use   Smoking status: Former    Current packs/day: 0.00    Average packs/day: 1 pack/day for 12.0 years (12.0 ttl pk-yrs)    Types: Cigarettes    Start date: 11/12/1987    Quit date: 11/12/1999    Years since quitting: 23.0   Smokeless tobacco: Never  Vaping Use   Vaping status: Never Used  Substance and Sexual Activity   Alcohol use: Yes    Alcohol/week: 12.0 standard drinks of alcohol  Types: 12 Cans of beer per week    Comment: 12 beer per week   Drug use: Never   Sexual activity: Yes  Other Topics Concern   Not on file  Social History Narrative   Not on file   Social Determinants of Health   Financial Resource Strain: Not on file  Food Insecurity: Not on file  Transportation Needs: Not on file  Physical Activity: Not on file  Stress: Not on file  Social Connections: Not on file  Intimate Partner Violence: Not on file    SUBJECTIVE  Review of  Systems Constitutional: Patient denies any unintentional weight loss or change in strength lntegumentary: Patient denies any rashes or pruritus Cardiovascular: Patient denies chest pain or syncope Respiratory: Patient denies shortness of breath Gastrointestinal: Patient denies nausea, vomiting, constipation, or diarrhea Musculoskeletal: Patient denies muscle cramps or weakness Neurologic: Patient denies convulsions or seizures Psychiatric: Patient denies memory problems Allergic/Immunologic: Patient denies recent allergic reaction(s) Hematologic/Lymphatic: Patient denies bleeding tendencies Endocrine: Patient denies heat/cold intolerance  GU: As per HPI.  OBJECTIVE Vitals:   11/24/22 0838  BP: 136/81  Pulse: (!) 42  Temp: 97.8 F (36.6 C)   There is no height or weight on file to calculate BMI.  Physical Examination  Constitutional: No obvious distress; patient is non-toxic appearing  Cardiovascular: No visible lower extremity edema.  Respiratory: The patient does not have audible wheezing/stridor; respirations do not appear labored  Gastrointestinal: Abdomen non-distended Musculoskeletal: Normal ROM of UEs  Skin: No obvious rashes/open sores  Neurologic: CN 2-12 grossly intact Psychiatric: Answered questions appropriately with normal affect  Hematologic/Lymphatic/Immunologic: No obvious bruises or sites of spontaneous bleeding  Genitourinary: Anal sphincter has normal tone. Prostate is approximately 15 grams and is symmetric and non-nodular without palpable masses, tenderness, warmth, or bogginess.  Rectal vault is without lesions or masses.  Chaperone offered for pelvic exam; patient declined.  UA: negative  PVR: 53 ml  ASSESSMENT Benign prostatic hyperplasia with urinary frequency - Plan: PSA, tamsulosin (FLOMAX) 0.4 MG CAPS capsule  Nocturia - Plan: Urinalysis, Routine w reflex microscopic, BLADDER SCAN AMB NON-IMAGING, PR COMPLEX UROFLOMETRY, tamsulosin (FLOMAX)  0.4 MG CAPS capsule  Feeling of incomplete bladder emptying - Plan: Urinalysis, Routine w reflex microscopic, BLADDER SCAN AMB NON-IMAGING, PR COMPLEX UROFLOMETRY, tamsulosin (FLOMAX) 0.4 MG CAPS capsule  Unilateral undescended testicle, unspecified location  H/O unilateral orchiectomy  Infertility male  Varicocele  Gross hematuria - Plan: Cytology, urine, CT ABDOMEN PELVIS W WO CONTRAST  Prostate cancer screening - Plan: PSA  Former smoker  1. Gross hematuria:  For management of gross hematuria, we discussed possible etiologies including but not limited to: vigorous exercise, sexual activity, stone, trauma, blood thinner use, urinary tract infection, kidney function, BPH, prostatitis, malignancy. We discussed pt's smoking as a risk factor for GU cancer and encouraged continued smoking cessation.  We discussed the importance of work-up including assessing the upper and lower GU tract with CT urogram and cystoscopy. We will also check voided cytology today.  We discussed the risk for clot retention and pt was advised to increase fluid intake to thin out clots. Pt was advised to go to the ER if they become unable to urinate due to clot retention, start having symptoms of anemia (which were discussed), or any other significant concerning acute symptoms.  2. BPH with LUTS: After careful review of all available data and history and physical examination, it is felt that this patient has symptomatic BPH/BOO. Options for treatment included alpha blockers, 5-AR  inhibitors, laser, and TURP were discussed.  For alpha-1 blockers we considered Flomax (Tamsulosin), Uroxatrol (Alfuzosin), Cardura (Doxazosin), Rapaflo (Silodosin), and Hytrin (Terazosin). Discussed mechanism of action (relaxes smooth muscle of prostate and bladder neck to decrease BOO and improve urine flow). Fast acting. Potential side effects were discussed such as lowering blood pressure, orthostatic hypotension, dizziness, etc (less  common with Tamsulosin and Uroxatrol).  We discussed recommendation to check PSA value, which is a protein produced in normal and neoplastic cells. Age norms and PSA velocity were discussed. Elevated serum PSA may be due to infection, inflammation, BPH, recent urinary catheterization, or cancer. Pt verbalized understanding and agreement; PSA testing ordered.  Patient was advised that if/when UTI-like symptoms occur they can call our office to speak with a nurse, who can place an order for urinalysis (with reflex to urine culture). They may then proceed to a [facility] to provide their urine sample.   3. Varicocele. Asymptomatic.  4. Cryptorchidism s/p right orchiectomy.  5. Male infertility.  Will plan for follow up for cystoscopy with Dr. Ronne Binning. Patient verbalized understanding and agreement. All questions were answered.   PLAN Advised the following: 1. PSA.  2. Voided cytology. 3. CT abdomen/pelvis. 4. Start Flomax 0.4 mg daily. 5. Return for 1st available cystoscopy with Dr. Ronne Binning.  Orders Placed This Encounter  Procedures   CT ABDOMEN PELVIS W WO CONTRAST    Standing Status:   Future    Standing Expiration Date:   11/24/2023    Order Specific Question:   If indicated for the ordered procedure, I authorize the administration of contrast media per Radiology protocol    Answer:   Yes    Order Specific Question:   Does the patient have a contrast media/X-ray dye allergy?    Answer:   No    Order Specific Question:   Preferred imaging location?    Answer:   [facility]    Order Specific Question:   If indicated for the ordered procedure, I authorize the administration of oral contrast media per Radiology protocol    Answer:   Yes   Urinalysis, Routine w reflex microscopic   PSA   PR COMPLEX UROFLOMETRY   BLADDER SCAN AMB NON-IMAGING    It has been explained that the patient is to follow regularly with their PCP in addition to all other providers  involved in their care and to follow instructions provided by these respective offices. Patient advised to contact urology clinic if any urologic-pertaining questions, concerns, new symptoms or problems arise in the interim period.  There are no Patient Instructions on file for this visit.  Electronically signed by:  Donnita Falls, MSN, FNP-C, CUNP 11/24/2022 9:35 AM

## 2022-11-24 ENCOUNTER — Encounter: Payer: Self-pay | Admitting: Urology

## 2022-11-24 ENCOUNTER — Ambulatory Visit (INDEPENDENT_AMBULATORY_CARE_PROVIDER_SITE_OTHER): Payer: No Typology Code available for payment source | Admitting: Urology

## 2022-11-24 VITALS — BP 136/81 | HR 42 | Temp 97.8°F

## 2022-11-24 DIAGNOSIS — R351 Nocturia: Secondary | ICD-10-CM | POA: Diagnosis not present

## 2022-11-24 DIAGNOSIS — N401 Enlarged prostate with lower urinary tract symptoms: Secondary | ICD-10-CM

## 2022-11-24 DIAGNOSIS — R350 Frequency of micturition: Secondary | ICD-10-CM

## 2022-11-24 DIAGNOSIS — R3914 Feeling of incomplete bladder emptying: Secondary | ICD-10-CM | POA: Diagnosis not present

## 2022-11-24 DIAGNOSIS — N469 Male infertility, unspecified: Secondary | ICD-10-CM

## 2022-11-24 DIAGNOSIS — R310 Gross hematuria: Secondary | ICD-10-CM

## 2022-11-24 DIAGNOSIS — Q5310 Unspecified undescended testicle, unilateral: Secondary | ICD-10-CM

## 2022-11-24 DIAGNOSIS — Z87891 Personal history of nicotine dependence: Secondary | ICD-10-CM

## 2022-11-24 DIAGNOSIS — Z125 Encounter for screening for malignant neoplasm of prostate: Secondary | ICD-10-CM

## 2022-11-24 DIAGNOSIS — Q539 Undescended testicle, unspecified: Secondary | ICD-10-CM | POA: Insufficient documentation

## 2022-11-24 DIAGNOSIS — Z9079 Acquired absence of other genital organ(s): Secondary | ICD-10-CM

## 2022-11-24 DIAGNOSIS — I861 Scrotal varices: Secondary | ICD-10-CM

## 2022-11-24 LAB — URINALYSIS, ROUTINE W REFLEX MICROSCOPIC
Bilirubin, UA: NEGATIVE
Glucose, UA: NEGATIVE
Ketones, UA: NEGATIVE
Leukocytes,UA: NEGATIVE
Nitrite, UA: NEGATIVE
Protein,UA: NEGATIVE
RBC, UA: NEGATIVE
Specific Gravity, UA: 1.02 (ref 1.005–1.030)
Urobilinogen, Ur: 0.2 mg/dL (ref 0.2–1.0)
pH, UA: 6 (ref 5.0–7.5)

## 2022-11-24 LAB — BLADDER SCAN AMB NON-IMAGING: Scan Result: 53

## 2022-11-24 MED ORDER — TAMSULOSIN HCL 0.4 MG PO CAPS: 0.4000 mg | ORAL_CAPSULE | Freq: Every day | ORAL | 11 refills | Status: DC

## 2022-11-24 NOTE — Progress Notes (Signed)
Uroflow  Peak Flow: 21ml Average Flow: 10ml Voided Volume: 25ml Voiding Time: 10sec Flow Time: 3sec Time to Peak Flow: 8sec  PVR Volume: 53ml

## 2022-11-25 LAB — PSA: Prostate Specific Ag, Serum: 1 ng/mL (ref 0.0–4.0)

## 2022-11-26 LAB — CYTOLOGY, URINE

## 2022-12-09 ENCOUNTER — Ambulatory Visit (HOSPITAL_COMMUNITY)
Admission: RE | Admit: 2022-12-09 | Discharge: 2022-12-09 | Disposition: A | Discharge status: Home / Self Care | Payer: No Typology Code available for payment source | Source: Ambulatory Visit | Attending: Urology | Admitting: Urology

## 2022-12-09 DIAGNOSIS — R310 Gross hematuria: Secondary | ICD-10-CM | POA: Diagnosis present

## 2022-12-09 LAB — POCT I-STAT CREATININE: Creatinine, Ser: 0.8 mg/dL (ref 0.61–1.24)

## 2022-12-09 MED ORDER — IOHEXOL 300 MG/ML  SOLN
125.0000 mL | Freq: Once | INTRAMUSCULAR | Status: AC | PRN
  Administered 2022-12-09: 125 mL via INTRAVENOUS

## 2022-12-10 ENCOUNTER — Ambulatory Visit: Payer: No Typology Code available for payment source | Admitting: Urology

## 2022-12-20 NOTE — Progress Notes (Signed)
Please notify patient:  - CT showed nothing to explain hematuria - no GU stones, masses, or hydronephrosis. Advised to follow up for cystoscopy as planned for further evaluation of that.  - There was an incidental finding of an indeterminate liver lesion measuring 1.3 x 1.0 cm. Radiologist recommended that she get "nonemergent multiphasic contrast enhanced MRI for further characterization." She needs to consult with her PCP about getting that done.

## 2022-12-21 ENCOUNTER — Telehealth: Payer: Self-pay

## 2022-12-21 NOTE — Telephone Encounter (Signed)
Patient is made aware and voiced understanding.

## 2022-12-21 NOTE — Telephone Encounter (Signed)
-----   Message from Donnita Falls sent at 12/20/2022  8:38 AM EDT ----- Please notify patient:  - CT showed nothing to explain hematuria - no GU stones, masses, or hydronephrosis. Advised to follow up for cystoscopy as planned for further evaluation of that.  - There was an incidental finding of an indeterminate liver lesion measuring 1.3 x 1.0 cm. Radiologist recommended that she get "nonemergent multiphasic contrast enhanced MRI for further characterization." She needs to consult with her PCP about getting that done.

## 2023-01-24 ENCOUNTER — Ambulatory Visit (INDEPENDENT_AMBULATORY_CARE_PROVIDER_SITE_OTHER): Payer: No Typology Code available for payment source | Admitting: Internal Medicine

## 2023-01-24 ENCOUNTER — Encounter: Payer: Self-pay | Admitting: Internal Medicine

## 2023-01-24 VITALS — BP 137/73 | HR 48 | Ht 70.0 in | Wt 167.0 lb

## 2023-01-24 DIAGNOSIS — Z1159 Encounter for screening for other viral diseases: Secondary | ICD-10-CM

## 2023-01-24 DIAGNOSIS — Z1339 Encounter for screening examination for other mental health and behavioral disorders: Secondary | ICD-10-CM

## 2023-01-24 DIAGNOSIS — Z13220 Encounter for screening for lipoid disorders: Secondary | ICD-10-CM

## 2023-01-24 DIAGNOSIS — K769 Liver disease, unspecified: Secondary | ICD-10-CM | POA: Diagnosis not present

## 2023-01-24 DIAGNOSIS — N401 Enlarged prostate with lower urinary tract symptoms: Secondary | ICD-10-CM | POA: Diagnosis not present

## 2023-01-24 DIAGNOSIS — Z114 Encounter for screening for human immunodeficiency virus [HIV]: Secondary | ICD-10-CM

## 2023-01-24 DIAGNOSIS — I10 Essential (primary) hypertension: Secondary | ICD-10-CM | POA: Diagnosis not present

## 2023-01-24 DIAGNOSIS — Z23 Encounter for immunization: Secondary | ICD-10-CM

## 2023-01-24 DIAGNOSIS — L659 Nonscarring hair loss, unspecified: Secondary | ICD-10-CM

## 2023-01-24 DIAGNOSIS — Z131 Encounter for screening for diabetes mellitus: Secondary | ICD-10-CM

## 2023-01-24 DIAGNOSIS — Z1329 Encounter for screening for other suspected endocrine disorder: Secondary | ICD-10-CM

## 2023-01-24 DIAGNOSIS — Z1321 Encounter for screening for nutritional disorder: Secondary | ICD-10-CM

## 2023-01-24 DIAGNOSIS — R350 Frequency of micturition: Secondary | ICD-10-CM

## 2023-01-24 DIAGNOSIS — D649 Anemia, unspecified: Secondary | ICD-10-CM

## 2023-01-24 NOTE — Assessment & Plan Note (Signed)
Currently prescribed lisinopril-HCTZ 20-25 mg daily.  BP today is 137/73.  No medication changes are indicated.

## 2023-01-24 NOTE — Assessment & Plan Note (Signed)
Influenza vaccine ordered today

## 2023-01-24 NOTE — Patient Instructions (Signed)
It was a pleasure to see you today.  Thank you for giving [place] the opportunity to be involved in your care.  Below is a brief recap of your visit and next steps.  We will plan to see you again in 6 months.  Summary You have established care today We will check basic labs MRI liver ordered Flu shot today  Follow up in 6 months

## 2023-01-24 NOTE — Assessment & Plan Note (Signed)
Segment 5 hepatic lesion incidentally noted on CT abdomen pelvis from 9/5.  Follow-up MRI recommended.  This has been ordered today.

## 2023-01-24 NOTE — Addendum Note (Signed)
Addended by: Christel Mormon E on: 01/24/2023 01:33 PM   Modules accepted: Orders

## 2023-01-24 NOTE — Assessment & Plan Note (Signed)
Symptoms are adequately controlled with Flomax.  No changes are indicated.

## 2023-01-24 NOTE — Progress Notes (Signed)
New Patient Office Visit  Subjective    Patient ID: Keith Adkins, male    DOB: [birth year]  Age: 52 y.o. MRN: [id number]  CC:  Chief Complaint  Patient presents with   Establish Care    Previous [facility] pt.     HPI Keith Adkins presents to establish care.  He is a 52 year old male who endorses a past medical history significant for HTN, BPH, alopecia, and alcohol use.  Previously followed by Dr. Sudie Bailey.  Mr. Keith Adkins reports feeling well today.  He is asymptomatic and has no acute concerns to discuss aside from desiring to establish care.  He currently works as a [employer] architect.  He endorses daily alcohol consumption, drinking 2 to alcoholic drinks per day during the week and upwards of 6 drinks on the weekend.  He endorses former tobacco use, quitting 24 years ago.  Denies illicit drug use.  His family medical history is significant for CAD, DM, breast and ovarian cancer.  Chronic medical conditions and outstanding preventative care items discussed today are individually addressed in A/P below.  Outpatient Encounter Medications as of 01/24/2023  Medication Sig   cyclobenzaprine (FLEXERIL) 10 MG tablet Take 10 mg by mouth 3 (three) times daily.   finasteride (PROPECIA) 1 MG tablet Take 1 mg by mouth daily.   ibuprofen (ADVIL) 800 MG tablet Take by mouth.   lisinopril-hydrochlorothiazide (ZESTORETIC) 20-25 MG tablet Take 1 tablet by mouth every morning.   tamsulosin (FLOMAX) 0.4 MG CAPS capsule Take 1 capsule (0.4 mg total) by mouth daily.   No facility-administered encounter medications on file as of 01/24/2023.    Past Medical History:  Diagnosis Date   HTN (hypertension)     Past Surgical History:  Procedure Laterality Date   COLONOSCOPY WITH PROPOFOL N/A 11/13/2019   Procedure: COLONOSCOPY WITH PROPOFOL;  Surgeon: Lanelle Bal, DO;  Location: [facility];  Service: Endoscopy;  Laterality: N/A;  12:15pm   ORCHIECTOMY Right    age 81   testical removal Right      Family History  Problem Relation Age of Onset   Ovarian cancer Mother    Breast cancer Mother    Heart attack Father    Breast cancer Sister    Colon cancer Neg Hx     Social History   Socioeconomic History   Marital status: Divorced    Spouse name: Not on file   Number of children: Not on file   Years of education: 12   Highest education level: Not on file  Occupational History   Occupation: Human resources officer: [employer]  Tobacco Use   Smoking status: Former    Current packs/day: 0.00    Average packs/day: 1 pack/day for 12.0 years (12.0 ttl pk-yrs)    Types: Cigarettes    Start date: 11/12/1987    Quit date: 11/12/1999    Years since quitting: 23.2   Smokeless tobacco: Never  Vaping Use   Vaping status: Never Used  Substance and Sexual Activity   Alcohol use: Yes    Alcohol/week: 12.0 standard drinks of alcohol    Types: 12 Cans of beer per week    Comment: 12 beer per week   Drug use: Not Currently   Sexual activity: Not Currently  Other Topics Concern   Not on file  Social History Narrative   Not on file   Social Determinants of Health   Financial Resource Strain: Not on file  Food Insecurity: Not on file  Transportation Needs: Not on file  Physical Activity: Not on file  Stress: Not on file  Social Connections: Not on file  Intimate Partner Violence: Not on file   Review of Systems  Constitutional:  Negative for chills and fever.  HENT:  Negative for sore throat.   Respiratory:  Negative for cough and shortness of breath.   Cardiovascular:  Negative for chest pain, palpitations and leg swelling.  Gastrointestinal:  Negative for abdominal pain, blood in stool, constipation, diarrhea, nausea and vomiting.  Genitourinary:  Negative for dysuria and hematuria.  Musculoskeletal:  Negative for myalgias.  Skin:  Negative for itching and rash.  Neurological:  Negative for dizziness and headaches.  Psychiatric/Behavioral:  Negative for depression  and suicidal ideas.    Objective    BP 137/73   Pulse (!) 48   Ht 5\' 10"  (1.778 m)   Wt 167 lb (75.8 kg)   SpO2 98%   BMI 23.96 kg/m   Physical Exam Vitals reviewed.  Constitutional:      General: He is not in acute distress.    Appearance: Normal appearance. He is not ill-appearing.  HENT:     Head: Normocephalic and atraumatic.     Right Ear: External ear normal.     Left Ear: External ear normal.     Nose: Nose normal. No congestion or rhinorrhea.     Mouth/Throat:     Mouth: Mucous membranes are moist.     Pharynx: Oropharynx is clear.  Eyes:     General: No scleral icterus.    Extraocular Movements: Extraocular movements intact.     Conjunctiva/sclera: Conjunctivae normal.     Pupils: Pupils are equal, round, and reactive to light.  Cardiovascular:     Rate and Rhythm: Normal rate and regular rhythm.     Pulses: Normal pulses.     Heart sounds: Normal heart sounds. No murmur heard. Pulmonary:     Effort: Pulmonary effort is normal.     Breath sounds: Normal breath sounds. No wheezing, rhonchi or rales.  Abdominal:     General: Abdomen is flat. Bowel sounds are normal. There is no distension.     Palpations: Abdomen is soft.     Tenderness: There is no abdominal tenderness.  Musculoskeletal:        General: No swelling or deformity. Normal range of motion.     Cervical back: Normal range of motion.  Skin:    General: Skin is warm and dry.     Capillary Refill: Capillary refill takes less than 2 seconds.  Neurological:     General: No focal deficit present.     Mental Status: He is alert and oriented to person, place, and time.     Motor: No weakness.  Psychiatric:        Mood and Affect: Mood normal.        Behavior: Behavior normal.        Thought Content: Thought content normal.    Assessment & Plan:   Problem List Items Addressed This Visit       Essential hypertension    Currently prescribed lisinopril-HCTZ 20-25 mg daily.  BP today is 137/73.   No medication changes are indicated.      Liver lesion, right lobe - Primary    Segment 5 hepatic lesion incidentally noted on CT abdomen pelvis from 9/5.  Follow-up MRI recommended.  This has been ordered today.      Alopecia    Currently prescribed Propecia.  No medication changes are indicated.      Benign prostatic hyperplasia with urinary frequency    Symptoms are adequately controlled with Flomax.  No changes are indicated.      Need for influenza vaccination    Influenza vaccine ordered today      Return in about 6 months (around 07/25/2023).   Billie Lade, MD

## 2023-01-24 NOTE — Assessment & Plan Note (Signed)
Currently prescribed Propecia.  No medication changes are indicated.

## 2023-01-27 ENCOUNTER — Ambulatory Visit (HOSPITAL_COMMUNITY): Payer: No Typology Code available for payment source

## 2023-01-29 LAB — VITAMIN D 25 HYDROXY (VIT D DEFICIENCY, FRACTURES): Vit D, 25-Hydroxy: 45.8 ng/mL (ref 30.0–100.0)

## 2023-01-29 LAB — TSH+FREE T4
Free T4: 1.2 ng/dL (ref 0.82–1.77)
TSH: 0.824 u[IU]/mL (ref 0.450–4.500)

## 2023-01-29 LAB — CBC WITH DIFFERENTIAL/PLATELET
Basophils Absolute: 0 10*3/uL (ref 0.0–0.2)
Basos: 0 %
EOS (ABSOLUTE): 0 10*3/uL (ref 0.0–0.4)
Eos: 0 %
Hematocrit: 39.3 % (ref 37.5–51.0)
Hemoglobin: 13.3 g/dL (ref 13.0–17.7)
Immature Grans (Abs): 0 10*3/uL (ref 0.0–0.1)
Immature Granulocytes: 0 %
Lymphocytes Absolute: 1.2 10*3/uL (ref 0.7–3.1)
Lymphs: 24 %
MCH: 32.8 pg (ref 26.6–33.0)
MCHC: 33.8 g/dL (ref 31.5–35.7)
MCV: 97 fL (ref 79–97)
Monocytes Absolute: 0.5 10*3/uL (ref 0.1–0.9)
Monocytes: 9 %
Neutrophils Absolute: 3.2 10*3/uL (ref 1.4–7.0)
Neutrophils: 67 %
Platelets: 189 10*3/uL (ref 150–450)
RBC: 4.05 x10E6/uL — ABNORMAL LOW (ref 4.14–5.80)
RDW: 11.8 % (ref 11.6–15.4)
WBC: 4.9 10*3/uL (ref 3.4–10.8)

## 2023-01-29 LAB — CMP14+EGFR
ALT: 21 [IU]/L (ref 0–44)
AST: 24 [IU]/L (ref 0–40)
Albumin: 4.5 g/dL (ref 3.8–4.9)
Alkaline Phosphatase: 115 [IU]/L (ref 44–121)
BUN/Creatinine Ratio: 19 (ref 9–20)
BUN: 14 mg/dL (ref 6–24)
Bilirubin Total: 0.5 mg/dL (ref 0.0–1.2)
CO2: 24 mmol/L (ref 20–29)
Calcium: 9.2 mg/dL (ref 8.7–10.2)
Chloride: 101 mmol/L (ref 96–106)
Creatinine, Ser: 0.75 mg/dL — ABNORMAL LOW (ref 0.76–1.27)
Globulin, Total: 2.4 g/dL (ref 1.5–4.5)
Glucose: 94 mg/dL (ref 70–99)
Potassium: 4.1 mmol/L (ref 3.5–5.2)
Sodium: 139 mmol/L (ref 134–144)
Total Protein: 6.9 g/dL (ref 6.0–8.5)
eGFR: 107 mL/min/{1.73_m2} (ref >59)

## 2023-01-29 LAB — LIPID PANEL
Chol/HDL Ratio: 1.9 ratio (ref 0.0–5.0)
Cholesterol, Total: 198 mg/dL (ref 100–199)
HDL: 102 mg/dL (ref >39)
LDL Chol Calc (NIH): 87 mg/dL (ref 0–99)
Triglycerides: 47 mg/dL (ref 0–149)
VLDL Cholesterol Cal: 9 mg/dL (ref 5–40)

## 2023-01-29 LAB — HEMOGLOBIN A1C
Est. average glucose Bld gHb Est-mCnc: 105 mg/dL
Hgb A1c MFr Bld: 5.3 % (ref 4.8–5.6)

## 2023-01-29 LAB — B12 AND FOLATE PANEL
Folate: 10.7 ng/mL (ref >3.0)
Vitamin B-12: 509 pg/mL (ref 232–1245)

## 2023-01-29 LAB — HCV INTERPRETATION

## 2023-01-29 LAB — HIV ANTIBODY (ROUTINE TESTING W REFLEX): HIV Screen 4th Generation wRfx: NONREACTIVE

## 2023-01-29 LAB — HCV AB W REFLEX TO QUANT PCR: HCV Ab: NONREACTIVE

## 2023-02-07 ENCOUNTER — Ambulatory Visit (HOSPITAL_COMMUNITY)
Admission: RE | Admit: 2023-02-07 | Discharge: 2023-02-07 | Disposition: A | Discharge status: Home / Self Care | Payer: No Typology Code available for payment source | Source: Ambulatory Visit | Attending: Internal Medicine | Admitting: Internal Medicine

## 2023-02-07 DIAGNOSIS — K769 Liver disease, unspecified: Secondary | ICD-10-CM | POA: Insufficient documentation

## 2023-02-07 MED ORDER — GADOBUTROL 1 MMOL/ML IV SOLN
7.0000 mL | Freq: Once | INTRAVENOUS | Status: AC | PRN
  Administered 2023-02-07: 7 mL via INTRAVENOUS

## 2023-02-14 ENCOUNTER — Ambulatory Visit (INDEPENDENT_AMBULATORY_CARE_PROVIDER_SITE_OTHER): Payer: No Typology Code available for payment source | Admitting: Urology

## 2023-02-14 VITALS — BP 146/75 | HR 45

## 2023-02-14 DIAGNOSIS — R310 Gross hematuria: Secondary | ICD-10-CM

## 2023-02-14 DIAGNOSIS — N400 Enlarged prostate without lower urinary tract symptoms: Secondary | ICD-10-CM

## 2023-02-14 LAB — URINALYSIS, ROUTINE W REFLEX MICROSCOPIC
Bilirubin, UA: NEGATIVE
Glucose, UA: NEGATIVE
Ketones, UA: NEGATIVE
Leukocytes,UA: NEGATIVE
Nitrite, UA: NEGATIVE
Protein,UA: NEGATIVE
RBC, UA: NEGATIVE
Specific Gravity, UA: 1.01 (ref 1.005–1.030)
Urobilinogen, Ur: 0.2 mg/dL (ref 0.2–1.0)
pH, UA: 7 (ref 5.0–7.5)

## 2023-02-14 MED ORDER — ALFUZOSIN HCL ER 10 MG PO TB24: 10.0000 mg | ORAL_TABLET | Freq: Every evening | ORAL | 11 refills | Status: DC

## 2023-02-14 MED ORDER — CIPROFLOXACIN HCL 500 MG PO TABS
500.0000 mg | ORAL_TABLET | Freq: Once | ORAL | Status: AC
Start: 2023-02-14 — End: 2023-02-14
  Administered 2023-02-14: 500 mg via ORAL

## 2023-02-14 MED ORDER — SULFAMETHOXAZOLE-TRIMETHOPRIM 800-160 MG PO TABS: 1.0000 | ORAL_TABLET | Freq: Two times a day (BID) | ORAL | 0 refills | Status: DC

## 2023-02-14 NOTE — Progress Notes (Unsigned)
   02/14/23  CC: microhematuria   HPI: Keith Adkins is a 52yo here for cystoscopy for microhematuria Blood pressure (!) 146/75, pulse (!) 45. NED. A&Ox3.   No respiratory distress   Abd soft, NT, ND Normal phallus with bilateral descended testicles  Cystoscopy Procedure Note  Patient identification was confirmed, informed consent was obtained, and patient was prepped using Betadine solution.  Lidocaine jelly was administered per urethral meatus.     Pre-Procedure: - Inspection reveals a normal caliber ureteral meatus.  Procedure: The flexible cystoscope was introduced without difficulty - No urethral strictures/lesions are present. - Enlarged prostate erythematous prostatic urethra - Normal bladder neck - Bilateral ureteral orifices identified - Bladder mucosa  reveals no ulcers, tumors, or lesions - No bladder stones - No trabeculation  Retroflexion shows erythematous bladder neck   Post-Procedure: - Patient tolerated the procedure well  Assessment/ Plan: Switch flomax to uroxatral. Bactrim DS BID for 28 days  No follow-ups on file.  Keith Aye, MD

## 2023-02-15 ENCOUNTER — Encounter: Payer: Self-pay | Admitting: Urology

## 2023-02-15 NOTE — Patient Instructions (Signed)

## 2023-05-16 ENCOUNTER — Ambulatory Visit (INDEPENDENT_AMBULATORY_CARE_PROVIDER_SITE_OTHER): Payer: No Typology Code available for payment source | Admitting: Urology

## 2023-05-16 VITALS — BP 127/74 | HR 43

## 2023-05-16 DIAGNOSIS — N469 Male infertility, unspecified: Secondary | ICD-10-CM

## 2023-05-16 DIAGNOSIS — R351 Nocturia: Secondary | ICD-10-CM

## 2023-05-16 DIAGNOSIS — R3914 Feeling of incomplete bladder emptying: Secondary | ICD-10-CM

## 2023-05-16 DIAGNOSIS — N400 Enlarged prostate without lower urinary tract symptoms: Secondary | ICD-10-CM

## 2023-05-16 LAB — URINALYSIS, ROUTINE W REFLEX MICROSCOPIC
Bilirubin, UA: NEGATIVE
Glucose, UA: NEGATIVE
Ketones, UA: NEGATIVE
Leukocytes,UA: NEGATIVE
Nitrite, UA: NEGATIVE
Protein,UA: NEGATIVE
RBC, UA: NEGATIVE
Specific Gravity, UA: 1.03 (ref 1.005–1.030)
Urobilinogen, Ur: 0.2 mg/dL (ref 0.2–1.0)
pH, UA: 6 (ref 5.0–7.5)

## 2023-05-16 MED ORDER — ALFUZOSIN HCL ER 10 MG PO TB24: 10.0000 mg | ORAL_TABLET | Freq: Every evening | ORAL | 3 refills | Status: DC

## 2023-05-16 NOTE — Progress Notes (Addendum)
05/16/2023 1:43 PM   Keith Adkins 11-Nov-1970 [id number]  Referring provider: Billie Lade, MD [address] [place],  [place] [address]  nocturia   HPI: Mr Edelen is a 52yo here for followup for BPh with nocturia. IPSS 0 QOL 0 on uroxatral 10mg  daily. Urine stream strong. Nocturia 0x. No straining to urinate. He and his partner have been trying to conceive for several years. No environmental exposures. No prior conceptions. No other complaints today   PMH: Past Medical History:  Diagnosis Date   HTN (hypertension)     Surgical History: Past Surgical History:  Procedure Laterality Date   COLONOSCOPY WITH PROPOFOL N/A 11/13/2019   Procedure: COLONOSCOPY WITH PROPOFOL;  Surgeon: Lanelle Bal, DO;  [facility]: [facility];  Service: Endoscopy;  Laterality: N/A;  12:15pm   ORCHIECTOMY Right    age 16   testical removal Right     Home Medications:  Allergies as of 05/16/2023   No Known Allergies      Medication List        Accurate as of May 16, 2023  1:43 PM. If you have any questions, ask your nurse or doctor.          STOP taking these medications    cyclobenzaprine 10 MG tablet Commonly known as: FLEXERIL   sulfamethoxazole-trimethoprim 800-160 MG tablet Commonly known as: BACTRIM DS   tamsulosin 0.4 MG Caps capsule Commonly known as: FLOMAX       TAKE these medications    alfuzosin 10 MG 24 hr tablet Commonly known as: UROXATRAL Take 1 tablet (10 mg total) by mouth at bedtime.   finasteride 1 MG tablet Commonly known as: PROPECIA Take 1 mg by mouth daily.   ibuprofen 800 MG tablet Commonly known as: ADVIL Take by mouth.   lisinopril-hydrochlorothiazide 20-25 MG tablet Commonly known as: ZESTORETIC Take 1 tablet by mouth every morning.        Allergies: No Known Allergies  Family History: Family History  Problem Relation Age of Onset   Ovarian cancer Mother    Breast cancer Mother    Heart attack Father     Breast cancer Sister    Colon cancer Neg Hx     Social History:  reports that he quit smoking about 23 years ago. His smoking use included cigarettes. He started smoking about 35 years ago. He has a 12 pack-year smoking history. He has never used smokeless tobacco. He reports current alcohol use of about 12.0 standard drinks of alcohol per week. He reports that he does not currently use drugs.  ROS: All other review of systems were reviewed and are negative except what is noted above in HPI  Physical Exam: BP 127/74   Pulse (!) 43   Constitutional:  Alert and oriented, No acute distress. HEENT: [place] AT, moist mucus membranes.  Trachea midline, no masses. Cardiovascular: No clubbing, cyanosis, or edema. Respiratory: Normal respiratory effort, no increased work of breathing. GI: Abdomen is soft, nontender, nondistended, no abdominal masses GU: No CVA tenderness.  Lymph: No cervical or inguinal lymphadenopathy. Skin: No rashes, bruises or suspicious lesions. Neurologic: Grossly intact, no focal deficits, moving all 4 extremities. Psychiatric: Normal mood and affect.  Laboratory Data: Lab Results  Component Value Date   WBC 4.9 01/28/2023   HGB 13.3 01/28/2023   HCT 39.3 01/28/2023   MCV 97 01/28/2023   PLT 189 01/28/2023    Lab Results  Component Value Date   CREATININE 0.75 (  L) 01/28/2023    No results found for: "PSA"  No results found for: "TESTOSTERONE"  Lab Results  Component Value Date   HGBA1C 5.3 01/28/2023    Urinalysis    Component Value Date/Time   APPEARANCEUR Clear 02/14/2023 1116   GLUCOSEU Negative 02/14/2023 1116   BILIRUBINUR Negative 02/14/2023 1116   PROTEINUR Negative 02/14/2023 1116   NITRITE Negative 02/14/2023 1116   LEUKOCYTESUR Negative 02/14/2023 1116    Lab Results  Component Value Date   LABMICR Comment 02/14/2023    Pertinent Imaging:  No results found for this or any previous visit.  No results found for this or any previous  visit.  No results found for this or any previous visit.  No results found for this or any previous visit.  No results found for this or any previous visit.  No results found for this or any previous visit.  No results found for this or any previous visit.  No results found for this or any previous visit.   Assessment & Plan:    1. Benign prostatic hyperplasia, unspecified whether lower urinary tract symptoms present (Primary) Continue uroxatral 10mg  daily - Urinalysis, Routine w reflex microscopic  2. Nocturia Continue uroxatral 10mg  daily  3. Feeling of incomplete bladder emptying Continue uroxatral 10mg  daily  4. Infertility -semen analysis, will call with results   No follow-ups on file.  Wilkie Aye, MD  [facility] [place]

## 2023-05-16 NOTE — Patient Instructions (Addendum)
Please contact [employer] at [phone] to schedule semen analysis. You will be required to bring the lab order and the specimen cups provided by our office to [address] [place] [address], use entrance [address].

## 2023-05-24 ENCOUNTER — Encounter: Payer: Self-pay | Admitting: Urology

## 2023-07-08 LAB — SEMEN ANALYSIS, BASIC
Appearance: NORMAL
Concentration, Sperm: 19.4 x10E6/mL (ref >14.9)
Immotile Sperm: 40 %
Leukocyte Concentration: <1 x10E6/mL (ref <1.00)
Non-Progressive (NP): 21 %
Normal Morphology-Strict: 9 % (ref >3)
Progressive Motility (PR): 39 % (ref >31)
Progressively Motile Sperm: 22.8 x10E6
Time Collected: 1445
Time Received: 1530
Time Since Last Emission: 3 d
Total Motile Sperm: 34.7 x10E6
Total Motility (PR+NP): 60 % (ref >39)
Total Sperm in Ejaculate: 58.1 x10E6 (ref >38.9)
Volume: 3 mL (ref >1.4)
pH: 8.5 (ref >7.1)

## 2023-07-25 ENCOUNTER — Ambulatory Visit (INDEPENDENT_AMBULATORY_CARE_PROVIDER_SITE_OTHER): Payer: No Typology Code available for payment source | Admitting: Internal Medicine

## 2023-07-25 VITALS — BP 132/68 | HR 60 | Ht 70.0 in | Wt 175.6 lb

## 2023-07-25 DIAGNOSIS — I10 Essential (primary) hypertension: Secondary | ICD-10-CM | POA: Diagnosis not present

## 2023-07-25 DIAGNOSIS — K769 Liver disease, unspecified: Secondary | ICD-10-CM | POA: Diagnosis not present

## 2023-07-25 NOTE — Assessment & Plan Note (Addendum)
 Remains adequately controlled with losartan-HCTZ 20-25 mg daily.  No medication changes necessary today.

## 2023-07-25 NOTE — Assessment & Plan Note (Addendum)
 MRI on 11/4 revealed benign poorly filling hepatic hemangioma that requires no further workup.

## 2023-07-25 NOTE — Progress Notes (Signed)
 Established Patient Office Visit  Subjective   Patient ID: Keith Adkins, male    DOB: May 22, 1970  Age: 53 y.o. MRN: [id number]  Chief Complaint  Patient presents with   Care Management    Six month follow up    Keith Adkins presents for 6 month follow-up. He was last seen on October 2024 to establish care.  An MRI of the liver was ordered to follow-up on incidentally noted liver lesion from CT in September.  This revealed a benign hemangioma that required no further work-up.  Keith Adkins reports feeling well today.  He is asymptomatic and has no acute concerns to discuss.   Past Medical History:  Diagnosis Date   HTN (hypertension)    Past Surgical History:  Procedure Laterality Date   COLONOSCOPY WITH PROPOFOL  N/A 11/13/2019   Procedure: COLONOSCOPY WITH PROPOFOL ;  Surgeon: Vinetta Greening, DO;  Location: [facility];  Service: Endoscopy;  Laterality: N/A;  12:15pm   ORCHIECTOMY Right    age 30   testical removal Right    Social History   Tobacco Use   Smoking status: Former    Current packs/day: 0.00    Average packs/day: 1 pack/day for 12.0 years (12.0 ttl pk-yrs)    Types: Cigarettes    Start date: 11/12/1987    Quit date: 11/12/1999    Years since quitting: 23.7   Smokeless tobacco: Never  Vaping Use   Vaping status: Never Used  Substance Use Topics   Alcohol use: Yes    Alcohol/week: 12.0 standard drinks of alcohol    Types: 12 Cans of beer per week    Comment: 12 beer per week   Drug use: Not Currently   Family History  Problem Relation Age of Onset   Ovarian cancer Mother    Breast cancer Mother    Heart attack Father    Breast cancer Sister    Colon cancer Neg Hx    No Known Allergies  Review of Systems  Constitutional:  Negative for chills and fever.  HENT:  Negative for sore throat.   Respiratory:  Negative for cough and shortness of breath.   Cardiovascular:  Negative for chest pain, palpitations and leg swelling.  Gastrointestinal:  Negative  for abdominal pain, blood in stool, constipation, diarrhea, nausea and vomiting.  Genitourinary:  Negative for dysuria, frequency, hematuria and urgency.  Musculoskeletal:  Negative for myalgias.  Skin:  Negative for itching and rash.  Neurological:  Negative for dizziness and headaches.  Psychiatric/Behavioral:  Negative for depression and suicidal ideas.      Objective:     BP 132/68   Pulse 60   Ht 5\' 10"  (1.778 m)   Wt 175 lb 9.6 oz (79.7 kg)   SpO2 97%   BMI 25.20 kg/m  BP Readings from Last 3 Encounters:  07/25/23 132/68  05/16/23 127/74  02/14/23 (!) 146/75   Physical Exam Vitals reviewed.  Constitutional:      General: He is not in acute distress.    Appearance: Normal appearance. He is normal weight. He is not ill-appearing.  HENT:     Head: Normocephalic and atraumatic.     Right Ear: External ear normal.     Left Ear: External ear normal.     Nose: Nose normal. No congestion or rhinorrhea.     Mouth/Throat:     Mouth: Mucous membranes are moist.     Pharynx: Oropharynx is clear.  Eyes:     General: No scleral  icterus.    Extraocular Movements: Extraocular movements intact.     Conjunctiva/sclera: Conjunctivae normal.     Pupils: Pupils are equal, round, and reactive to light.  Cardiovascular:     Rate and Rhythm: Normal rate and regular rhythm.     Pulses: Normal pulses.     Heart sounds: Normal heart sounds. No murmur heard. Pulmonary:     Effort: Pulmonary effort is normal.     Breath sounds: Normal breath sounds. No wheezing, rhonchi or rales.  Abdominal:     General: Abdomen is flat. Bowel sounds are normal. There is no distension.     Palpations: Abdomen is soft.     Tenderness: There is no abdominal tenderness.  Musculoskeletal:        General: No swelling or deformity. Normal range of motion.     Cervical back: Normal range of motion.  Skin:    General: Skin is warm and dry.     Capillary Refill: Capillary refill takes less than 2 seconds.   Neurological:     General: No focal deficit present.     Mental Status: He is alert and oriented to person, place, and time.     Motor: No weakness.  Psychiatric:        Mood and Affect: Mood normal.        Behavior: Behavior normal.        Thought Content: Thought content normal.   Last CBC Lab Results  Component Value Date   WBC 4.9 01/28/2023   HGB 13.3 01/28/2023   HCT 39.3 01/28/2023   MCV 97 01/28/2023   MCH 32.8 01/28/2023   RDW 11.8 01/28/2023   PLT 189 01/28/2023   Last metabolic panel Lab Results  Component Value Date   GLUCOSE 94 01/28/2023   NA 139 01/28/2023   K 4.1 01/28/2023   CL 101 01/28/2023   CO2 24 01/28/2023   BUN 14 01/28/2023   CREATININE 0.75 (L) 01/28/2023   EGFR 107 01/28/2023   CALCIUM 9.2 01/28/2023   PROT 6.9 01/28/2023   ALBUMIN 4.5 01/28/2023   LABGLOB 2.4 01/28/2023   BILITOT 0.5 01/28/2023   ALKPHOS 115 01/28/2023   AST 24 01/28/2023   ALT 21 01/28/2023   ANIONGAP 10 11/12/2019   Last lipids Lab Results  Component Value Date   CHOL 198 01/28/2023   HDL 102 01/28/2023   LDLCALC 87 01/28/2023   TRIG 47 01/28/2023   CHOLHDL 1.9 01/28/2023   Last hemoglobin A1c Lab Results  Component Value Date   HGBA1C 5.3 01/28/2023   Last thyroid functions Lab Results  Component Value Date   TSH 0.824 01/28/2023   Last vitamin D  Lab Results  Component Value Date   VD25OH 45.8 01/28/2023   Last vitamin B12 and Folate Lab Results  Component Value Date   VITAMINB12 509 01/28/2023   FOLATE 10.7 01/28/2023     Assessment & Plan:   Problem List Items Addressed This Visit     Essential hypertension   Remains adequately controlled with losartan-HCTZ 20-25 mg daily.  No medication changes necessary today.      Liver lesion, right lobe - Primary   MRI on 11/4 revealed benign poorly filling hepatic hemangioma that requires no further workup.       Return in about 6 months (around 01/24/2024) for CPE.    Tobi Fortes,  MD

## 2023-07-25 NOTE — Patient Instructions (Signed)
It was a pleasure to see you today.  Thank you for giving [place] the opportunity to be involved in your care.  Below is a brief recap of your visit and next steps.  We will plan to see you again in 6 months  Summary No medication changes today Follow up in 6 months

## 2023-10-18 ENCOUNTER — Other Ambulatory Visit: Payer: Self-pay

## 2023-10-18 ENCOUNTER — Ambulatory Visit
Admission: RE | Admit: 2023-10-18 | Discharge: 2023-10-18 | Disposition: A | Discharge status: Home / Self Care | Source: Ambulatory Visit | Attending: Internal Medicine | Admitting: Internal Medicine

## 2023-10-18 VITALS — BP 115/71 | HR 53 | Temp 97.5°F | Resp 18

## 2023-10-18 DIAGNOSIS — R110 Nausea: Secondary | ICD-10-CM | POA: Diagnosis not present

## 2023-10-18 DIAGNOSIS — K2100 Gastro-esophageal reflux disease with esophagitis, without bleeding: Secondary | ICD-10-CM | POA: Diagnosis not present

## 2023-10-18 DIAGNOSIS — R197 Diarrhea, unspecified: Secondary | ICD-10-CM | POA: Diagnosis not present

## 2023-10-18 MED ORDER — ONDANSETRON 4 MG PO TBDP
4.0000 mg | ORAL_TABLET | Freq: Once | ORAL | Status: AC
  Administered 2023-10-18: 4 mg via ORAL

## 2023-10-18 MED ORDER — ONDANSETRON 4 MG PO TBDP: 4.0000 mg | ORAL_TABLET | Freq: Three times a day (TID) | ORAL | 0 refills | Status: DC | PRN

## 2023-10-18 MED ORDER — LIDOCAINE VISCOUS HCL 2 % MT SOLN
15.0000 mL | Freq: Once | OROMUCOSAL | Status: AC
  Administered 2023-10-18: 15 mL via OROMUCOSAL

## 2023-10-18 MED ORDER — FAMOTIDINE 20 MG PO TABS: 20.0000 mg | ORAL_TABLET | Freq: Two times a day (BID) | ORAL | 0 refills | Status: DC

## 2023-10-18 MED ORDER — ALUM & MAG HYDROXIDE-SIMETH 200-200-20 MG/5ML PO SUSP
30.0000 mL | Freq: Once | ORAL | Status: AC
  Administered 2023-10-18: 30 mL via ORAL

## 2023-10-18 NOTE — ED Provider Notes (Signed)
 [facility]    CSN: [id number] Arrival date & time: 10/18/23  0954      History   Chief Complaint Chief Complaint  Patient presents with   Heartburn    ExtremeHeartburn, upset stomach, - Entered by patient    HPI Keith Adkins is a 53 y.o. male.   Patient presents to urgent care for evaluation of nausea without vomiting, diarrhea, decreased appetite, and acid reflux sensation that started 5 days ago on Thursday, October 13, 2023.  He became very nauseous suddenly, developed generalized crampy abdominal pain, and had multiple episodes of diarrhea when symptoms initially started on July 10.  He has had persistent nausea and diarrhea ever since.  Denies blood in output.  No vomiting, dizziness, heart palpitations, fever/chills, chest pain, back pain, syncope, recent intake of foods outside of normal diet, intake of water  from unknown source, intake of raw/undercooked meats, or recent sick contacts with similar symptoms.  Denies recent excessive alcohol intake, spicy/acidic foods, NSAID use, etc. States he has felt very fatigued since symptom onset.  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  Shortness of breath has improved. He took some Mylanta last night and this helped with acid reflux.  He is currently minimally symptomatic.    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 Authorizing Provider  famotidine  (PEPCID ) 20 MG tablet Take 1 tablet (20 mg total) by mouth 2 (two) times daily. 10/18/23  Yes Enedelia Dorna HERO, FNP  ondansetron  (ZOFRAN -ODT) 4 MG disintegrating tablet Take 1 tablet (4 mg total) by mouth every 8 (eight) hours as needed for nausea or vomiting. 10/18/23  Yes Enedelia Dorna HERO, FNP  alfuzosin  (UROXATRAL ) 10 MG 24 hr tablet Take 1 tablet (10 mg total) by mouth at bedtime. 05/16/23   McKenzie, Belvie CROME, MD  finasteride (PROPECIA) 1 MG tablet Take 1 mg by mouth daily.    [provider]  ibuprofen (ADVIL) 800 MG tablet Take by mouth. 10/14/20   [provider]  lisinopril-hydrochlorothiazide (ZESTORETIC) 20-25 MG tablet Take 1 tablet by mouth every morning. 04/09/19   [provider]    Family History Family History  Problem Relation Age of Onset   Ovarian cancer Mother    Breast cancer Mother    Heart attack Father    Breast cancer Sister    Colon cancer Neg Hx     Social History Social  History   Tobacco Use   Smoking status: Former    Current packs/day: 0.00    Average packs/day: 1 pack/day for 12.0 years (12.0 ttl pk-yrs)    Types: Cigarettes    Start date: 11/12/1987    Quit date: 11/12/1999    Years since quitting: 23.9   Smokeless tobacco: Never  Vaping Use   Vaping status: Never Used  Substance Use Topics   Alcohol use: Yes    Alcohol/week: 12.0 standard drinks of alcohol    Types: 12 Cans of beer per week    Comment: 12 beer per week   Drug use: Not Currently     Allergies    Patient has no known allergies.   Review of Systems Review of Systems  Gastrointestinal:  Positive for heartburn.  Per HPI   Physical Exam Triage Vital Signs ED Triage Vitals  Encounter Vitals Group     BP 10/18/23 1001 115/71     Girls Systolic BP Percentile --      Girls Diastolic BP Percentile --      Boys Systolic BP Percentile --      Boys Diastolic BP Percentile --      Pulse Rate 10/18/23 1001 (!) 53     Resp 10/18/23 1001 18     Temp 10/18/23 1001 (!) 97.5 F (36.4 C)     Temp Source 10/18/23 1001 Oral     SpO2 10/18/23 1001 97 %     Weight --      Height --      Head Circumference --      Peak Flow --      Pain Score 10/18/23 1002 0     Pain Loc --      Pain Education --      Exclude from Growth Chart --    No data found.  Updated Vital Signs BP 115/71 (BP Location: Right Arm)   Pulse (!) 53   Temp (!) 97.5 F (36.4 C) (Oral)   Resp 18   SpO2 97%   Visual Acuity Right Eye Distance:   Left Eye Distance:   Bilateral Distance:    Right Eye Near:   Left Eye Near:    Bilateral Near:     Physical Exam Vitals and nursing note reviewed.  Constitutional:      Appearance: He is not ill-appearing or toxic-appearing.  HENT:     Head: Normocephalic and atraumatic.     Right Ear: Hearing and external ear normal.     Left Ear: Hearing and external ear normal.     Nose: Nose normal.     Mouth/Throat:     Lips: Pink.  Eyes:     General: Lids are normal. Vision grossly intact. Gaze aligned appropriately.     Extraocular Movements: Extraocular movements intact.     Conjunctiva/sclera: Conjunctivae normal.  Pulmonary:     Effort: Pulmonary effort is normal.  Musculoskeletal:     Cervical back: Neck supple.  Skin:    General: Skin is warm and dry.     Capillary Refill: Capillary refill takes less than 2 seconds.     Findings: No rash.  Neurological:     General: No focal deficit present.     Mental Status: He is alert and oriented to person, place,  and time. Mental status is at baseline.     Cranial Nerves: No dysarthria or facial asymmetry.  Psychiatric:        Mood and Affect: Mood normal.  Speech: Speech normal.        Behavior: Behavior normal.        Thought Content: Thought content normal.        Judgment: Judgment normal.      UC Treatments / Results  Labs (all labs ordered are listed, but only abnormal results are displayed) Labs Reviewed - No data to display  EKG   Radiology No results found.  Procedures Procedures (including critical care time)  Medications Ordered in UC Medications  ondansetron  (ZOFRAN -ODT) disintegrating tablet 4 mg (4 mg Oral Given 10/18/23 1022)  alum & mag hydroxide-simeth (MAALOX/MYLANTA) 200-200-20 MG/5ML suspension 30 mL (30 mLs Oral Given 10/18/23 1022)  lidocaine  (XYLOCAINE ) 2 % viscous mouth solution 15 mL (15 mLs Mouth/Throat Given 10/18/23 1022)    Initial Impression / Assessment and Plan / UC Course  I have reviewed the triage vital signs and the nursing notes.  Pertinent labs & imaging results that were available during my care of the patient were reviewed by me and considered in my medical decision making (see chart for details).   1. GERD, nausea without vomiting, diarrhea Unclear whether symptoms are viral and nausea/vomiting is triggering GERD or if gastritis is triggering diarrhea. Will treat with supportive care regardless.  Suspect viral etiology of symptoms contributing to/causing GERD from decreased appetite and increased acid production etc.  EKG shows sinus bradycardia with incomplete RBBB, history of RBBB at baseline on prior EKG from 2021, no ST/T wave changes on today's EKG. History of bradycardia. Low suspicion for cardiac etiology of symptoms, however given patient's history of HTN, former smoker, etc EKG was performed.   Vitals hemodynamically stable, he appears well hydrated.  BRAT diet encouraged, push fluids to stay well hydrated.  Zofran  4mg  ODT  and GI cocktail given in clinic with some relief prior to discharge.   Zofran  4mg  ODT every 8 hours PRN at home prescribed. Pepcid  20mg  BID for 7-14 days.   Lifestyle changes and dietary precautions related to GERD discussed.  Recommend follow-up with PCP in the next 7-10 days for re-check.   Counseled patient on potential for adverse effects with medications prescribed/recommended today, strict ER and return-to-clinic precautions discussed, patient verbalized understanding.    Final Clinical Impressions(s) / UC Diagnoses   Final diagnoses:  Nausea without vomiting  Diarrhea, unspecified type  Gastroesophageal reflux disease with esophagitis without hemorrhage     Discharge Instructions      Take prescribed medicines as directed. Medicine will help reduce the amount of acid your stomach makes and therefore improve your reflux symptoms related to acid production.   Pepcid  every 12 hours for 7-14 days.  Zofran  every 8 hours as needed for nausea/vomiting.  Drink lots of water , at least 64 ounces per day, more if you are sweating or drinking caffeine.   Avoid spicy or acidic foods like tomatoes, chocolate, coffee, or acidic fruits like oranges as these can trigger symptoms.  I have included acid reflux education in your packet for your review. Please also allow 2 hours after meals before lying flat to help prevent symptoms.   If your symptoms do not improve in the next 5-7 days with interventions, please return. Go to the emergency room for severe symptoms of shortness of breath, worsening or uncontrolled abdominal or chest pain, headache, light headedness, feeling faint, nausea, vomiting, bloody vomit or stools, black tarry stools, or any other new/severe symptoms. I hope you feel better!      ED Prescriptions  Medication Sig Dispense Auth. Provider   ondansetron  (ZOFRAN -ODT) 4 MG disintegrating tablet Take 1 tablet (4 mg total) by mouth every 8 (eight) hours as needed for  nausea or vomiting. 20 tablet Giavonni Cizek M, FNP   famotidine  (PEPCID ) 20 MG tablet Take 1 tablet (20 mg total) by mouth 2 (two) times daily. 30 tablet Enedelia Dorna HERO, FNP      PDMP not reviewed this encounter.   Enedelia Dorna HERO, [place] 10/18/23 1040

## 2023-10-18 NOTE — ED Triage Notes (Signed)
 Indigestion since Thursday with  diarrhea and feeling weak.  States SOB started yesterday.

## 2023-10-18 NOTE — Discharge Instructions (Addendum)
 Take prescribed medicines as directed. Medicine will help reduce the amount of acid your stomach makes and therefore improve your reflux symptoms related to acid production.   Pepcid  every 12 hours for 7-14 days.  Zofran  every 8 hours as needed for nausea/vomiting.  Drink lots of water , at least 64 ounces per day, more if you are sweating or drinking caffeine.   Avoid spicy or acidic foods like tomatoes, chocolate, coffee, or acidic fruits like oranges as these can trigger symptoms.  I have included acid reflux education in your packet for your review. Please also allow 2 hours after meals before lying flat to help prevent symptoms.   If your symptoms do not improve in the next 5-7 days with interventions, please return. Go to the emergency room for severe symptoms of shortness of breath, worsening or uncontrolled abdominal or chest pain, headache, light headedness, feeling faint, nausea, vomiting, bloody vomit or stools, black tarry stools, or any other new/severe symptoms. I hope you feel better!

## 2024-01-09 ENCOUNTER — Other Ambulatory Visit: Payer: Self-pay

## 2024-01-10 MED ORDER — ALFUZOSIN HCL ER 10 MG PO TB24: 10.0000 mg | ORAL_TABLET | Freq: Every evening | ORAL | 3 refills | Status: AC

## 2024-01-12 ENCOUNTER — Ambulatory Visit

## 2024-01-23 ENCOUNTER — Ambulatory Visit (INDEPENDENT_AMBULATORY_CARE_PROVIDER_SITE_OTHER)

## 2024-01-23 VITALS — BP 143/62 | HR 49 | Ht 70.0 in | Wt 176.0 lb

## 2024-01-23 DIAGNOSIS — M722 Plantar fascial fibromatosis: Secondary | ICD-10-CM

## 2024-01-23 DIAGNOSIS — L0390 Cellulitis, unspecified: Secondary | ICD-10-CM

## 2024-01-23 DIAGNOSIS — D649 Anemia, unspecified: Secondary | ICD-10-CM

## 2024-01-23 DIAGNOSIS — I10 Essential (primary) hypertension: Secondary | ICD-10-CM

## 2024-01-23 DIAGNOSIS — Z23 Encounter for immunization: Secondary | ICD-10-CM | POA: Diagnosis not present

## 2024-01-23 MED ORDER — LISINOPRIL-HYDROCHLOROTHIAZIDE 20-25 MG PO TABS: 1.0000 | ORAL_TABLET | Freq: Every morning | ORAL | 3 refills | Status: AC

## 2024-01-23 MED ORDER — CEPHALEXIN 500 MG PO CAPS: 500.0000 mg | ORAL_CAPSULE | Freq: Three times a day (TID) | ORAL | 0 refills | Status: DC

## 2024-01-23 NOTE — Progress Notes (Unsigned)
 Established Patient Office Visit  Subjective   Patient ID: Keith Adkins, male    DOB: 18-Nov-1970  Age: 53 y.o. MRN: [id number]  Chief Complaint  Patient presents with   Medical Management of Chronic Issues    Follow up, Pt needs refill of lisinopril and he wants a refill of antibiotics that was gave to him through urgent care     HPI Discussed the use of AI scribe software for clinical note transcription with the patient, who gave verbal consent to proceed.  History of Present Illness   Keith Adkins is a 53 year old male who presents for a follow-up visit and medication refills.  Foot pain - Significant pain localized to the foot, most severe in the morning upon waking - Pain improves with movement throughout the day - No prior use of stretching or icing for symptom relief - Recent increase in symptoms following completion of a race - No mention of trauma or injury preceding onset of pain  Radicular leg pain (sciatica) - History of sciatica, exacerbated by running routine - Pain described as a chore to manage during running - Stretching exercises have provided some relief in the past - Occasional use of ibuprofen 800 mg for severe pain  Recent foot cellulitis and cat bite - Episode of foot cellulitis three weeks ago, required urgent care visit - Treated with Cefalexin 500 mg - No known injury preceding cellulitis; awoke with an open wound on the foot - Recent cat bite with associated swelling, required antibiotics - No ongoing signs of infection described  Hypertension - Currently managing high blood pressure - Running low on antihypertensive medication and requires refill      Patient Active Problem List   Diagnosis Date Noted   Plantar fasciitis of right foot 01/26/2024   Essential hypertension 01/24/2023   Alopecia 01/24/2023   Liver lesion, right lobe 01/24/2023   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/2022   H/O unilateral orchiectomy 11/24/2022    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  Constitutional:      Appearance: Normal appearance.  HENT:     Head: Normocephalic.     Right Ear: Tympanic membrane, ear canal and external ear normal.     Left Ear: Tympanic membrane, ear canal and external ear normal.     Nose: Nose normal.     Mouth/Throat:     Mouth: Mucous membranes are moist.     Pharynx: Oropharynx is clear.  Eyes:     Extraocular Movements: Extraocular movements intact.     Pupils: Pupils are equal, round, and reactive to light.  Cardiovascular:     Rate and Rhythm: Normal rate and regular rhythm.  Pulmonary:     Effort: Pulmonary effort is normal.     Breath sounds:  Normal breath sounds.  Musculoskeletal:     Cervical back: Normal range of motion and neck supple.       Feet:  Feet:     Right foot:     Skin integrity: Erythema present.  Skin:    General: Skin is warm and dry.  Neurological:     Mental Status: He is alert and oriented to person, place, and time.  Psychiatric:        Mood and Affect: Mood normal.        Thought Content: Thought content normal.     Last CBC Lab Results  Component Value Date   WBC 4.6 01/23/2024   HGB 14.4 01/23/2024   HCT 43.9 01/23/2024   MCV 98 (H) 01/23/2024   MCH 32.1 01/23/2024   RDW 12.0 01/23/2024   PLT 194 01/23/2024   Last metabolic panel Lab Results  Component Value Date   GLUCOSE 92  01/23/2024   NA 141 01/23/2024   K 4.7 01/23/2024   CL 101 01/23/2024   CO2 24 01/23/2024   BUN 15 01/23/2024   CREATININE 0.83 01/23/2024   EGFR 103 01/23/2024   CALCIUM 9.2 01/23/2024   PROT 7.4 01/23/2024   ALBUMIN 4.7 01/23/2024   LABGLOB 2.7 01/23/2024   BILITOT 0.4 01/23/2024   ALKPHOS 134 (H) 01/23/2024   AST 26 01/23/2024   ALT 21 01/23/2024   ANIONGAP 10 11/12/2019   Last lipids Lab Results  Component Value Date   CHOL 198 01/28/2023   HDL 102 01/28/2023   LDLCALC 87 01/28/2023   TRIG 47 01/28/2023   CHOLHDL 1.9 01/28/2023   Last hemoglobin A1c Lab Results  Component Value Date   HGBA1C 5.3 01/28/2023   Last thyroid functions Lab Results  Component Value Date   TSH 0.824 01/28/2023   FREET4 1.20 01/28/2023   Last vitamin D  Lab Results  Component Value Date   VD25OH 45.8 01/28/2023   Last vitamin B12 and Folate Lab Results  Component Value Date   VITAMINB12 509 01/28/2023   FOLATE 10.7 01/28/2023      The ASCVD Risk score (Arnett DK, et al., 2019) failed to calculate for the following reasons:   The valid HDL cholesterol range is 20 to 100 mg/dL    Assessment & Plan:   Problem List Items Addressed This Visit       Cardiovascular and Mediastinum   Essential hypertension - Primary   Remains adequately controlled with losartan-HCTZ 20-25 mg daily.  No medication changes necessary today.      Relevant Medications   lisinopril-hydrochlorothiazide (ZESTORETIC) 20-25 MG tablet   Other Relevant Orders   CMP14+EGFR (Completed)     Musculoskeletal and Integument   Plantar fasciitis of right foot   Chronic plantar fasciitis exacerbated by running. Pain severe in morning, improves with movement. No prior stretching or ice treatment. No surgical intervention desired. - Refer to podiatrist for evaluation and management. - Recommend stretching exercises and 90-degree foot brace at night. - Advise ice bottle for pain relief. - Consider x-ray for  heel spurs. - Discuss potential steroid injection if conservative measures fail.      Relevant Orders   Ambulatory referral to Podiatry     Other   Anemia   Update labs to reassess      Relevant Orders   CBC (Completed)   Fe+TIBC+Fer (Completed)   Other Visit Diagnoses       Encounter for immunization       Relevant Orders  Flu vaccine trivalent PF, 6mos and older(Flulaval,Afluria,Fluarix,Fluzone) (Completed)     Cellulitis, unspecified cellulitis site       Add oral antibiotic for ongoing cellulitis on top of right foot.   Relevant Medications   cephALEXin (KEFLEX) 500 MG capsule      No follow-ups on file.    Leita Longs, FNP

## 2024-01-24 LAB — CBC
Hematocrit: 43.9 % (ref 37.5–51.0)
Hemoglobin: 14.4 g/dL (ref 13.0–17.7)
MCH: 32.1 pg (ref 26.6–33.0)
MCHC: 32.8 g/dL (ref 31.5–35.7)
MCV: 98 fL — ABNORMAL HIGH (ref 79–97)
Platelets: 194 x10E3/uL (ref 150–450)
RBC: 4.48 x10E6/uL (ref 4.14–5.80)
RDW: 12 % (ref 11.6–15.4)
WBC: 4.6 x10E3/uL (ref 3.4–10.8)

## 2024-01-24 LAB — CMP14+EGFR
ALT: 21 IU/L (ref 0–44)
AST: 26 IU/L (ref 0–40)
Albumin: 4.7 g/dL (ref 3.8–4.9)
Alkaline Phosphatase: 134 IU/L — ABNORMAL HIGH (ref 47–123)
BUN/Creatinine Ratio: 18 (ref 9–20)
BUN: 15 mg/dL (ref 6–24)
Bilirubin Total: 0.4 mg/dL (ref 0.0–1.2)
CO2: 24 mmol/L (ref 20–29)
Calcium: 9.2 mg/dL (ref 8.7–10.2)
Chloride: 101 mmol/L (ref 96–106)
Creatinine, Ser: 0.83 mg/dL (ref 0.76–1.27)
Globulin, Total: 2.7 g/dL (ref 1.5–4.5)
Glucose: 92 mg/dL (ref 70–99)
Potassium: 4.7 mmol/L (ref 3.5–5.2)
Sodium: 141 mmol/L (ref 134–144)
Total Protein: 7.4 g/dL (ref 6.0–8.5)
eGFR: 103 mL/min/1.73 (ref >59)

## 2024-01-24 LAB — IRON,TIBC AND FERRITIN PANEL
Ferritin: 312 ng/mL (ref 30–400)
Iron Saturation: 26 % (ref 15–55)
Iron: 90 ug/dL (ref 38–169)
Total Iron Binding Capacity: 342 ug/dL (ref 250–450)
UIBC: 252 ug/dL (ref 111–343)

## 2024-01-26 ENCOUNTER — Ambulatory Visit: Payer: Self-pay

## 2024-01-26 DIAGNOSIS — M722 Plantar fascial fibromatosis: Secondary | ICD-10-CM | POA: Insufficient documentation

## 2024-01-26 NOTE — Assessment & Plan Note (Signed)
 Remains adequately controlled with losartan-HCTZ 20-25 mg daily.  No medication changes necessary today.

## 2024-01-26 NOTE — Assessment & Plan Note (Signed)
 Chronic plantar fasciitis exacerbated by running. Pain severe in morning, improves with movement. No prior stretching or ice treatment. No surgical intervention desired. - Refer to podiatrist for evaluation and management. - Recommend stretching exercises and 90-degree foot brace at night. - Advise ice bottle for pain relief. - Consider x-ray for heel spurs. - Discuss potential steroid injection if conservative measures fail.

## 2024-01-26 NOTE — Assessment & Plan Note (Signed)
 Update labs to reassess

## 2024-02-06 ENCOUNTER — Ambulatory Visit (INDEPENDENT_AMBULATORY_CARE_PROVIDER_SITE_OTHER): Admitting: Podiatry

## 2024-02-06 ENCOUNTER — Encounter: Payer: Self-pay | Admitting: Podiatry

## 2024-02-06 ENCOUNTER — Ambulatory Visit (INDEPENDENT_AMBULATORY_CARE_PROVIDER_SITE_OTHER)

## 2024-02-06 DIAGNOSIS — M722 Plantar fascial fibromatosis: Secondary | ICD-10-CM | POA: Diagnosis not present

## 2024-02-06 MED ORDER — DICLOFENAC SODIUM 75 MG PO TBEC: 75.0000 mg | DELAYED_RELEASE_TABLET | Freq: Two times a day (BID) | ORAL | 2 refills | Status: DC

## 2024-02-06 MED ORDER — TRIAMCINOLONE ACETONIDE 10 MG/ML IJ SUSP
10.0000 mg | Freq: Once | INTRAMUSCULAR | Status: AC
  Administered 2024-02-06: 10 mg via INTRA_ARTICULAR

## 2024-02-06 NOTE — Progress Notes (Signed)
 Subjective:   Patient ID: Keith Adkins, male   DOB: 53 y.o.   MRN: [id number]   HPI Patient presents stating he has developed a lot of pain in the bottom of the right heel and it has been hurting him for several months and worse with running after running and getting up in the morning especially.  Patient does not smoke and is active   Review of Systems  All other systems reviewed and are negative.       Objective:  Physical Exam Vitals and nursing note reviewed.  Constitutional:      Appearance: He is well-developed.  Pulmonary:     Effort: Pulmonary effort is normal.  Musculoskeletal:        General: Normal range of motion.  Skin:    General: Skin is warm.  Neurological:     Mental Status: He is alert.     Neurovascular status intact muscle strength found to be adequate range of motion adequate with discomfort mostly in the central and medial portion of the plantar fascia at the insertion to the calcaneus with a moderate reduction in the heel depth.  Good digital perfusion well oriented x 3     Assessment:  Acute plantar fasciitis right does have a natural narrow heel and does work a weightbearing job and likes to run 12 to 15 miles a week     Plan:  H&P x-rays reviewed today I did sterile prep injected the plantar fascia at insertion 3 mg Kenalog 5 mg Xylocaine  applied fascial brace properly fitted within the arch to lift the arch and take stress off the fascial insertion and reappoint for us  to recheck again in the next several weeks.  Also placed on oral anti-inflammatory diclofenac  X-rays indicate small spur no indication stress fracture arthritis around the calcaneus

## 2024-02-06 NOTE — Patient Instructions (Signed)

## 2024-02-20 ENCOUNTER — Ambulatory Visit: Admitting: Podiatry

## 2024-02-20 ENCOUNTER — Encounter: Payer: Self-pay | Admitting: Podiatry

## 2024-02-20 VITALS — Ht 70.0 in | Wt 176.0 lb

## 2024-02-20 DIAGNOSIS — M722 Plantar fascial fibromatosis: Secondary | ICD-10-CM | POA: Diagnosis not present

## 2024-02-22 NOTE — Progress Notes (Signed)
 Subjective:   Patient ID: Keith Adkins, male   DOB: 53 y.o.   MRN: [id number]   HPI The patient states that his feet are doing quite a bit better and states that the inflammation has reduced.  States he has been active and is having minimal discomfort   ROS      Objective:  Physical Exam  Neurovascular status intact muscle strength adequate with patient found to have significant improvement plantar fascial inflammation bilateral still present but much better with patient being very much wanting to return to normal activities     Assessment:  Acute plantar fasciitis bilateral improving     Plan:  H&P reviewed condition at great length.  Did discuss with him activities that I think will work best I discussed and explained continuing to take anti-inflammatories with slow reduction of them ice therapy and shoe gear modifications.  Patient will be seen back as needed may require orthotics or other treatment depending on response

## 2024-05-01 ENCOUNTER — Other Ambulatory Visit: Payer: Self-pay | Admitting: Podiatry

## 2024-05-14 ENCOUNTER — Ambulatory Visit: Payer: No Typology Code available for payment source | Admitting: Urology
